# Patient Record
Sex: Male | Born: 1955 | Race: Black or African American | Hispanic: No | Marital: Single | Smoking: Current some day smoker
Health system: Southern US, Community
[De-identification: ages and names within clinical notes are randomized; demographics above are authoritative.]

## PROBLEM LIST (undated history)

## (undated) DIAGNOSIS — F79 Unspecified intellectual disabilities: Secondary | ICD-10-CM

## (undated) DIAGNOSIS — I10 Essential (primary) hypertension: Secondary | ICD-10-CM

## (undated) DIAGNOSIS — K219 Gastro-esophageal reflux disease without esophagitis: Secondary | ICD-10-CM

## (undated) DIAGNOSIS — E785 Hyperlipidemia, unspecified: Secondary | ICD-10-CM

## (undated) DIAGNOSIS — M1990 Unspecified osteoarthritis, unspecified site: Secondary | ICD-10-CM

## (undated) HISTORY — DX: Gastro-esophageal reflux disease without esophagitis: K21.9

## (undated) HISTORY — DX: Hyperlipidemia, unspecified: E78.5

## (undated) HISTORY — DX: Essential (primary) hypertension: I10

## (undated) HISTORY — DX: Unspecified intellectual disabilities: F79

---

## 2005-06-25 ENCOUNTER — Ambulatory Visit: Payer: Self-pay | Admitting: Family Medicine

## 2005-06-30 ENCOUNTER — Ambulatory Visit: Payer: Self-pay | Admitting: *Deleted

## 2005-07-15 ENCOUNTER — Ambulatory Visit: Payer: Self-pay | Admitting: Family Medicine

## 2005-08-16 ENCOUNTER — Ambulatory Visit: Payer: Self-pay | Admitting: Family Medicine

## 2005-10-19 ENCOUNTER — Ambulatory Visit: Payer: Self-pay | Admitting: Family Medicine

## 2006-01-26 ENCOUNTER — Emergency Department (HOSPITAL_COMMUNITY): Admission: EM | Admit: 2006-01-26 | Discharge: 2006-01-27 | Payer: Self-pay | Admitting: *Deleted

## 2006-03-18 ENCOUNTER — Ambulatory Visit (HOSPITAL_COMMUNITY): Admission: RE | Admit: 2006-03-18 | Discharge: 2006-03-18 | Payer: Self-pay | Admitting: *Deleted

## 2006-05-22 ENCOUNTER — Emergency Department (HOSPITAL_COMMUNITY): Admission: EM | Admit: 2006-05-22 | Discharge: 2006-05-22 | Payer: Self-pay | Admitting: Emergency Medicine

## 2006-07-26 ENCOUNTER — Ambulatory Visit: Payer: Self-pay | Admitting: Family Medicine

## 2006-10-18 ENCOUNTER — Ambulatory Visit: Payer: Self-pay | Admitting: Family Medicine

## 2006-10-18 LAB — CONVERTED CEMR LAB
Creatinine, Ser: 0.89 mg/dL
Hemoglobin: 13.8 g/dL
RDW: 15.6 %
WBC, blood: 4.8 10*3/uL

## 2006-11-07 ENCOUNTER — Encounter (INDEPENDENT_AMBULATORY_CARE_PROVIDER_SITE_OTHER): Payer: Self-pay | Admitting: Family Medicine

## 2006-11-07 DIAGNOSIS — F70 Mild intellectual disabilities: Secondary | ICD-10-CM

## 2006-11-07 LAB — CONVERTED CEMR LAB: Creatinine, Ser: 0.89 mg/dL

## 2006-11-24 DIAGNOSIS — I10 Essential (primary) hypertension: Secondary | ICD-10-CM | POA: Insufficient documentation

## 2006-11-24 DIAGNOSIS — K219 Gastro-esophageal reflux disease without esophagitis: Secondary | ICD-10-CM | POA: Insufficient documentation

## 2006-11-24 DIAGNOSIS — E785 Hyperlipidemia, unspecified: Secondary | ICD-10-CM

## 2006-11-24 DIAGNOSIS — M1990 Unspecified osteoarthritis, unspecified site: Secondary | ICD-10-CM | POA: Insufficient documentation

## 2007-02-08 ENCOUNTER — Encounter (INDEPENDENT_AMBULATORY_CARE_PROVIDER_SITE_OTHER): Payer: Self-pay | Admitting: *Deleted

## 2007-03-02 ENCOUNTER — Encounter (INDEPENDENT_AMBULATORY_CARE_PROVIDER_SITE_OTHER): Payer: Self-pay | Admitting: Family Medicine

## 2007-03-13 ENCOUNTER — Encounter (INDEPENDENT_AMBULATORY_CARE_PROVIDER_SITE_OTHER): Payer: Self-pay | Admitting: Family Medicine

## 2007-07-17 ENCOUNTER — Encounter (INDEPENDENT_AMBULATORY_CARE_PROVIDER_SITE_OTHER): Payer: Self-pay | Admitting: *Deleted

## 2007-09-13 ENCOUNTER — Ambulatory Visit: Payer: Self-pay | Admitting: Family Medicine

## 2007-09-14 ENCOUNTER — Encounter (INDEPENDENT_AMBULATORY_CARE_PROVIDER_SITE_OTHER): Payer: Self-pay | Admitting: *Deleted

## 2007-09-29 LAB — CONVERTED CEMR LAB
ALT: 16 units/L (ref 0–53)
AST: 21 units/L (ref 0–37)
Albumin: 5.1 g/dL (ref 3.5–5.2)
Alkaline Phosphatase: 75 units/L (ref 39–117)
BUN: 10 mg/dL (ref 6–23)
Basophils Absolute: 0 10*3/uL (ref 0.0–0.1)
Basophils Relative: 0 % (ref 0–1)
CO2: 24 meq/L (ref 19–32)
Calcium: 10 mg/dL (ref 8.4–10.5)
Chloride: 101 meq/L (ref 96–112)
Cholesterol: 225 mg/dL — ABNORMAL HIGH (ref 0–200)
Creatinine, Ser: 0.76 mg/dL (ref 0.40–1.50)
Eosinophils Absolute: 0.2 10*3/uL (ref 0.0–0.7)
Eosinophils Relative: 4 % (ref 0–5)
Glucose, Bld: 89 mg/dL (ref 70–99)
HCT: 40.1 % (ref 39.0–52.0)
HDL: 80 mg/dL (ref >39)
Hemoglobin: 13.4 g/dL (ref 13.0–17.0)
LDL Cholesterol: 130 mg/dL — ABNORMAL HIGH (ref 0–99)
Lymphocytes Relative: 31 % (ref 12–46)
Lymphs Abs: 1.5 10*3/uL (ref 0.7–4.0)
MCHC: 33.4 g/dL (ref 30.0–36.0)
MCV: 87 fL (ref 78.0–100.0)
Monocytes Absolute: 0.3 10*3/uL (ref 0.1–1.0)
Monocytes Relative: 7 % (ref 3–12)
Neutro Abs: 2.9 10*3/uL (ref 1.7–7.7)
Neutrophils Relative %: 59 % (ref 43–77)
PSA: 0.15 ng/mL (ref 0.10–4.00)
Platelets: 264 10*3/uL (ref 150–400)
Potassium: 4.4 meq/L (ref 3.5–5.3)
RBC: 4.61 M/uL (ref 4.22–5.81)
RDW: 13.7 % (ref 11.5–15.5)
Sodium: 142 meq/L (ref 135–145)
TSH: 3.581 microintl units/mL (ref 0.350–5.50)
Total Bilirubin: 0.5 mg/dL (ref 0.3–1.2)
Total CHOL/HDL Ratio: 2.8
Total Protein: 8.6 g/dL — ABNORMAL HIGH (ref 6.0–8.3)
Triglycerides: 73 mg/dL (ref <150)
VLDL: 15 mg/dL (ref 0–40)
WBC: 5 10*3/uL (ref 4.0–10.5)

## 2008-01-26 ENCOUNTER — Telehealth (INDEPENDENT_AMBULATORY_CARE_PROVIDER_SITE_OTHER): Payer: Self-pay | Admitting: Family Medicine

## 2008-08-14 ENCOUNTER — Ambulatory Visit: Payer: Self-pay | Admitting: Family Medicine

## 2008-08-14 LAB — CONVERTED CEMR LAB
Albumin: 3.9 g/dL (ref 3.5–5.2)
Alkaline Phosphatase: 112 units/L (ref 39–117)
BUN: 7 mg/dL (ref 6–23)
Basophils Absolute: 0 10*3/uL (ref 0.0–0.1)
CO2: 25 meq/L (ref 19–32)
Eosinophils Absolute: 0.2 10*3/uL (ref 0.0–0.7)
Eosinophils Relative: 3 % (ref 0–5)
Glucose, Bld: 104 mg/dL — ABNORMAL HIGH (ref 70–99)
HCT: 39.4 % (ref 39.0–52.0)
MCHC: 35.3 g/dL (ref 30.0–36.0)
MCV: 81.1 fL (ref 78.0–100.0)
Monocytes Absolute: 0.5 10*3/uL (ref 0.1–1.0)
Platelets: 218 10*3/uL (ref 150–400)
Potassium: 4 meq/L (ref 3.5–5.3)
RDW: 15.1 % (ref 11.5–15.5)
Total Bilirubin: 0.6 mg/dL (ref 0.3–1.2)
Total CK: 148 units/L (ref 7–232)

## 2008-08-21 ENCOUNTER — Ambulatory Visit: Payer: Self-pay | Admitting: Vascular Surgery

## 2008-08-21 ENCOUNTER — Ambulatory Visit (HOSPITAL_COMMUNITY): Admission: RE | Admit: 2008-08-21 | Discharge: 2008-08-21 | Payer: Self-pay | Admitting: Family Medicine

## 2008-08-21 ENCOUNTER — Encounter (INDEPENDENT_AMBULATORY_CARE_PROVIDER_SITE_OTHER): Payer: Self-pay | Admitting: Family Medicine

## 2008-09-04 ENCOUNTER — Encounter (INDEPENDENT_AMBULATORY_CARE_PROVIDER_SITE_OTHER): Payer: Self-pay | Admitting: Family Medicine

## 2008-10-06 ENCOUNTER — Telehealth (INDEPENDENT_AMBULATORY_CARE_PROVIDER_SITE_OTHER): Payer: Self-pay | Admitting: Family Medicine

## 2008-10-08 ENCOUNTER — Encounter (INDEPENDENT_AMBULATORY_CARE_PROVIDER_SITE_OTHER): Payer: Self-pay | Admitting: *Deleted

## 2008-10-23 ENCOUNTER — Ambulatory Visit: Payer: Self-pay | Admitting: Family Medicine

## 2008-10-23 DIAGNOSIS — R5383 Other fatigue: Secondary | ICD-10-CM

## 2008-10-23 DIAGNOSIS — R5381 Other malaise: Secondary | ICD-10-CM | POA: Insufficient documentation

## 2008-10-29 ENCOUNTER — Encounter (INDEPENDENT_AMBULATORY_CARE_PROVIDER_SITE_OTHER): Payer: Self-pay | Admitting: Family Medicine

## 2008-10-30 LAB — CONVERTED CEMR LAB
Cholesterol: 253 mg/dL — ABNORMAL HIGH (ref 0–200)
Sed Rate: 73 mm/hr — ABNORMAL HIGH (ref 0–16)
Total CHOL/HDL Ratio: 3
Vitamin B-12: 292 pg/mL (ref 211–911)

## 2008-11-21 ENCOUNTER — Encounter (INDEPENDENT_AMBULATORY_CARE_PROVIDER_SITE_OTHER): Payer: Self-pay | Admitting: *Deleted

## 2008-11-21 LAB — CONVERTED CEMR LAB
Anti Nuclear Antibody(ANA): NEGATIVE
Rhuematoid fact SerPl-aCnc: <20 intl units/mL (ref 0–20)

## 2009-03-27 ENCOUNTER — Emergency Department (HOSPITAL_COMMUNITY): Admission: EM | Admit: 2009-03-27 | Discharge: 2009-03-27 | Payer: Self-pay | Admitting: Emergency Medicine

## 2009-05-09 ENCOUNTER — Telehealth: Payer: Self-pay | Admitting: Physician Assistant

## 2009-06-26 ENCOUNTER — Other Ambulatory Visit: Payer: Self-pay

## 2009-06-27 ENCOUNTER — Ambulatory Visit: Payer: Self-pay | Admitting: Psychiatry

## 2009-06-28 ENCOUNTER — Inpatient Hospital Stay (HOSPITAL_COMMUNITY): Admission: EM | Admit: 2009-06-28 | Discharge: 2009-06-30 | Payer: Self-pay | Admitting: Psychiatry

## 2009-08-26 ENCOUNTER — Ambulatory Visit: Payer: Self-pay | Admitting: Physician Assistant

## 2009-08-26 DIAGNOSIS — F489 Nonpsychotic mental disorder, unspecified: Secondary | ICD-10-CM | POA: Insufficient documentation

## 2009-09-08 ENCOUNTER — Encounter: Payer: Self-pay | Admitting: Physician Assistant

## 2009-09-09 ENCOUNTER — Telehealth: Payer: Self-pay | Admitting: Physician Assistant

## 2009-09-09 ENCOUNTER — Ambulatory Visit: Payer: Self-pay | Admitting: Physician Assistant

## 2009-09-09 LAB — CONVERTED CEMR LAB: Rapid HIV Screen: NEGATIVE

## 2009-09-16 ENCOUNTER — Ambulatory Visit: Payer: Self-pay | Admitting: Physician Assistant

## 2009-09-17 LAB — CONVERTED CEMR LAB
ALT: 93 units/L — ABNORMAL HIGH (ref 0–53)
AST: 309 units/L — ABNORMAL HIGH (ref 0–37)
Albumin: 3.7 g/dL (ref 3.5–5.2)
Alkaline Phosphatase: 130 units/L — ABNORMAL HIGH (ref 39–117)
Basophils Absolute: 0 10*3/uL (ref 0.0–0.1)
Basophils Relative: 0 % (ref 0–1)
Hemoglobin: 14.5 g/dL (ref 13.0–17.0)
LDL Cholesterol: 77 mg/dL (ref 0–99)
Lymphocytes Relative: 34 % (ref 12–46)
MCHC: 35.1 g/dL (ref 30.0–36.0)
Monocytes Absolute: 0.3 10*3/uL (ref 0.1–1.0)
Neutro Abs: 2.2 10*3/uL (ref 1.7–7.7)
Neutrophils Relative %: 56 % (ref 43–77)
Platelets: 147 10*3/uL — ABNORMAL LOW (ref 150–400)
Potassium: 3.6 meq/L (ref 3.5–5.3)
RDW: 16.5 % — ABNORMAL HIGH (ref 11.5–15.5)
Sodium: 138 meq/L (ref 135–145)
TSH: 4.012 microintl units/mL (ref 0.350–4.500)
Total Bilirubin: 0.9 mg/dL (ref 0.3–1.2)
Total Protein: 7.2 g/dL (ref 6.0–8.3)
Triglycerides: 128 mg/dL (ref <150)
VLDL: 26 mg/dL (ref 0–40)

## 2009-09-19 ENCOUNTER — Encounter: Payer: Self-pay | Admitting: Physician Assistant

## 2009-09-19 DIAGNOSIS — Z862 Personal history of diseases of the blood and blood-forming organs and certain disorders involving the immune mechanism: Secondary | ICD-10-CM

## 2009-09-19 DIAGNOSIS — Z8639 Personal history of other endocrine, nutritional and metabolic disease: Secondary | ICD-10-CM

## 2009-09-19 LAB — CONVERTED CEMR LAB
ALT: 75 units/L — ABNORMAL HIGH (ref 0–53)
AST: 122 units/L — ABNORMAL HIGH (ref 0–37)
Albumin: 3.9 g/dL (ref 3.5–5.2)
Alkaline Phosphatase: 133 units/L — ABNORMAL HIGH (ref 39–117)
Bilirubin, Direct: 0.2 mg/dL (ref 0.0–0.3)
GGT: 319 units/L — ABNORMAL HIGH (ref 7–51)
Hep B Core Total Ab: POSITIVE — AB
Hep B S Ab: POSITIVE — AB
Total Bilirubin: 0.6 mg/dL (ref 0.3–1.2)

## 2009-09-22 ENCOUNTER — Encounter: Payer: Self-pay | Admitting: Physician Assistant

## 2009-09-26 ENCOUNTER — Encounter (INDEPENDENT_AMBULATORY_CARE_PROVIDER_SITE_OTHER): Payer: Self-pay | Admitting: *Deleted

## 2009-09-26 ENCOUNTER — Ambulatory Visit: Payer: Self-pay | Admitting: Physician Assistant

## 2009-09-26 DIAGNOSIS — R9431 Abnormal electrocardiogram [ECG] [EKG]: Secondary | ICD-10-CM

## 2009-09-26 LAB — CONVERTED CEMR LAB
Bilirubin Urine: NEGATIVE
Ketones, urine, test strip: NEGATIVE
Nitrite: NEGATIVE
Protein, U semiquant: NEGATIVE
Urobilinogen, UA: 0.2

## 2009-09-29 DIAGNOSIS — E559 Vitamin D deficiency, unspecified: Secondary | ICD-10-CM | POA: Insufficient documentation

## 2009-09-29 LAB — CONVERTED CEMR LAB: Vit D, 25-Hydroxy: 20 ng/mL — ABNORMAL LOW (ref 30–89)

## 2009-10-01 ENCOUNTER — Encounter: Payer: Self-pay | Admitting: Physician Assistant

## 2009-10-01 ENCOUNTER — Encounter (INDEPENDENT_AMBULATORY_CARE_PROVIDER_SITE_OTHER): Payer: Self-pay | Admitting: Internal Medicine

## 2009-10-01 ENCOUNTER — Ambulatory Visit (HOSPITAL_COMMUNITY): Admission: RE | Admit: 2009-10-01 | Discharge: 2009-10-01 | Payer: Self-pay | Admitting: Internal Medicine

## 2009-10-01 ENCOUNTER — Ambulatory Visit: Payer: Self-pay | Admitting: Cardiology

## 2009-10-03 ENCOUNTER — Encounter: Payer: Self-pay | Admitting: Physician Assistant

## 2009-10-06 ENCOUNTER — Encounter: Payer: Self-pay | Admitting: Physician Assistant

## 2009-10-07 ENCOUNTER — Encounter (INDEPENDENT_AMBULATORY_CARE_PROVIDER_SITE_OTHER): Payer: Self-pay | Admitting: *Deleted

## 2009-10-10 ENCOUNTER — Encounter: Payer: Self-pay | Admitting: Physician Assistant

## 2009-10-16 ENCOUNTER — Ambulatory Visit (HOSPITAL_COMMUNITY): Admission: RE | Admit: 2009-10-16 | Discharge: 2009-10-16 | Payer: Self-pay | Admitting: Internal Medicine

## 2009-10-24 ENCOUNTER — Ambulatory Visit: Payer: Self-pay | Admitting: Physician Assistant

## 2009-10-27 ENCOUNTER — Encounter: Payer: Self-pay | Admitting: Physician Assistant

## 2009-10-27 DIAGNOSIS — K7689 Other specified diseases of liver: Secondary | ICD-10-CM

## 2009-10-27 LAB — CONVERTED CEMR LAB
AST: 160 units/L — ABNORMAL HIGH (ref 0–37)
Alkaline Phosphatase: 120 units/L — ABNORMAL HIGH (ref 39–117)
Indirect Bilirubin: 0.7 mg/dL (ref 0.0–0.9)
Total Bilirubin: 1 mg/dL (ref 0.3–1.2)

## 2009-10-31 ENCOUNTER — Ambulatory Visit: Payer: Self-pay | Admitting: Physician Assistant

## 2009-11-25 ENCOUNTER — Ambulatory Visit: Payer: Self-pay | Admitting: Physician Assistant

## 2009-11-25 DIAGNOSIS — R634 Abnormal weight loss: Secondary | ICD-10-CM

## 2009-11-25 LAB — CONVERTED CEMR LAB
BUN: 10 mg/dL (ref 6–23)
CO2: 26 meq/L (ref 19–32)
Chloride: 99 meq/L (ref 96–112)
Eosinophils Absolute: 0.1 10*3/uL (ref 0.0–0.7)
Eosinophils Relative: 1 % (ref 0–5)
Glucose, Bld: 101 mg/dL — ABNORMAL HIGH (ref 70–99)
HCT: 37.9 % — ABNORMAL LOW (ref 39.0–52.0)
Hemoglobin: 13.3 g/dL (ref 13.0–17.0)
Indirect Bilirubin: 0.7 mg/dL (ref 0.0–0.9)
Lymphs Abs: 1.8 10*3/uL (ref 0.7–4.0)
MCHC: 35.1 g/dL (ref 30.0–36.0)
MCV: 91.1 fL (ref 78.0–100.0)
Monocytes Absolute: 0.6 10*3/uL (ref 0.1–1.0)
Monocytes Relative: 10 % (ref 3–12)
Potassium: 4.9 meq/L (ref 3.5–5.3)
RBC: 4.16 M/uL — ABNORMAL LOW (ref 4.22–5.81)
Total Bilirubin: 1 mg/dL (ref 0.3–1.2)
Total Protein: 7 g/dL (ref 6.0–8.3)
Triglycerides: 814 mg/dL — ABNORMAL HIGH (ref <150)
WBC: 5.8 10*3/uL (ref 4.0–10.5)

## 2009-11-28 ENCOUNTER — Telehealth: Payer: Self-pay | Admitting: Physician Assistant

## 2009-11-28 ENCOUNTER — Encounter (INDEPENDENT_AMBULATORY_CARE_PROVIDER_SITE_OTHER): Payer: Self-pay | Admitting: *Deleted

## 2009-11-28 ENCOUNTER — Encounter: Payer: Self-pay | Admitting: Physician Assistant

## 2009-12-03 ENCOUNTER — Encounter: Payer: Self-pay | Admitting: Physician Assistant

## 2009-12-10 ENCOUNTER — Telehealth: Payer: Self-pay | Admitting: Physician Assistant

## 2009-12-16 ENCOUNTER — Encounter (INDEPENDENT_AMBULATORY_CARE_PROVIDER_SITE_OTHER): Payer: Self-pay | Admitting: *Deleted

## 2009-12-23 ENCOUNTER — Telehealth: Payer: Self-pay | Admitting: Physician Assistant

## 2009-12-23 ENCOUNTER — Encounter: Payer: Self-pay | Admitting: Physician Assistant

## 2009-12-29 ENCOUNTER — Encounter (INDEPENDENT_AMBULATORY_CARE_PROVIDER_SITE_OTHER): Payer: Self-pay | Admitting: *Deleted

## 2009-12-29 ENCOUNTER — Encounter: Payer: Self-pay | Admitting: Physician Assistant

## 2010-01-06 ENCOUNTER — Encounter: Payer: Self-pay | Admitting: Physician Assistant

## 2010-01-06 ENCOUNTER — Ambulatory Visit: Payer: Self-pay | Admitting: Gastroenterology

## 2010-01-06 DIAGNOSIS — K7010 Alcoholic hepatitis without ascites: Secondary | ICD-10-CM

## 2010-01-06 LAB — CONVERTED CEMR LAB
Ferritin: 406.1 ng/mL — ABNORMAL HIGH (ref 22.0–322.0)
Folate: 6.5 ng/mL
Saturation Ratios: 27.6 % (ref 20.0–50.0)

## 2010-01-07 ENCOUNTER — Telehealth: Payer: Self-pay | Admitting: Gastroenterology

## 2010-01-20 ENCOUNTER — Ambulatory Visit: Payer: Self-pay | Admitting: Gastroenterology

## 2010-01-22 ENCOUNTER — Ambulatory Visit: Payer: Self-pay | Admitting: Physician Assistant

## 2010-01-22 ENCOUNTER — Telehealth: Payer: Self-pay | Admitting: Physician Assistant

## 2010-01-22 DIAGNOSIS — F1010 Alcohol abuse, uncomplicated: Secondary | ICD-10-CM | POA: Insufficient documentation

## 2010-01-23 LAB — CONVERTED CEMR LAB
BUN: 5 mg/dL — ABNORMAL LOW (ref 6–23)
CO2: 28 meq/L (ref 19–32)
Calcium: 9.7 mg/dL (ref 8.4–10.5)
Chloride: 100 meq/L (ref 96–112)
Creatinine, Ser: 0.75 mg/dL (ref 0.40–1.50)

## 2010-01-29 ENCOUNTER — Encounter (INDEPENDENT_AMBULATORY_CARE_PROVIDER_SITE_OTHER): Payer: Self-pay | Admitting: *Deleted

## 2010-02-02 ENCOUNTER — Ambulatory Visit: Payer: Self-pay | Admitting: Gastroenterology

## 2010-02-02 ENCOUNTER — Encounter: Payer: Self-pay | Admitting: Physician Assistant

## 2010-02-02 DIAGNOSIS — K2270 Barrett's esophagus without dysplasia: Secondary | ICD-10-CM

## 2010-02-02 LAB — CONVERTED CEMR LAB: UREASE: NEGATIVE

## 2010-02-03 ENCOUNTER — Ambulatory Visit: Payer: Self-pay | Admitting: Physician Assistant

## 2010-02-05 ENCOUNTER — Encounter: Payer: Self-pay | Admitting: Gastroenterology

## 2010-02-11 ENCOUNTER — Encounter: Payer: Self-pay | Admitting: Physician Assistant

## 2010-03-10 ENCOUNTER — Encounter: Payer: Self-pay | Admitting: Gastroenterology

## 2010-05-05 ENCOUNTER — Emergency Department (HOSPITAL_COMMUNITY)
Admission: EM | Admit: 2010-05-05 | Discharge: 2010-05-05 | Payer: Self-pay | Source: Home / Self Care | Admitting: Family Medicine

## 2010-06-14 ENCOUNTER — Encounter: Payer: Self-pay | Admitting: Internal Medicine

## 2010-06-23 NOTE — Letter (Signed)
Summary: MED/SOLUTIONS//DENIAL  MED/SOLUTIONS//DENIAL   Imported By: Arta Bruce 10/06/2009 09:49:07  _____________________________________________________________________  External Attachment:    Type:   Image     Comment:   External Document

## 2010-06-23 NOTE — Letter (Signed)
Summary: *HSN Results Follow up  [facility]  [address] [place], [place] [address]   Phone: [phone]  Fax: [phone]      10/27/2009   ADRIAAN MALTESE [address] [place], [place]  [address]   Dear  Mr. Ruston Barlowe,                            ____S.Drinkard,FNP   ____D. Gore,FNP       ____B. McPherson,MD   ____V. Rankins,MD    ____E. Mulberry,MD    ____N. Daphine Deutscher, FNP  ____D. Reche Dixon, MD    ____K. Philipp Deputy, MD    __x__S. Alben Spittle, PA-C     This letter is to inform you that your recent test(s):  _______Pap Smear    _______Lab Test     _______X-ray    _______ is within acceptable limits  _______ requires a medication change  _______ requires a follow-up lab visit  _______ requires a follow-up visit with your provider   Comments: Echocardiogram (ultrasound on your heart) looked good.       _________________________________________________________ If you have any questions, please contact our office                     Sincerely,  Tereso Newcomer PA-C [facility]

## 2010-06-23 NOTE — Letter (Signed)
Summary: *HSN Results Follow up  [facility]  [address] [place], [place] [address]   Phone: [phone]  Fax: [phone]      10/07/2009   [name] [address] [place], [place]  [address]   Dear  Mr. Timothy Odonnell,                            ____S.Drinkard,FNP   __X__S. Alben Spittle, PA       ____B. McPherson,MD   ____V. Rankins,MD    ____E. Mulberry,MD    ____N. Daphine Deutscher, FNP  ____D. Reche Dixon, MD    ____K. Philipp Deputy, MD    ____Other     This letter is to inform you that your recent test(s):  _______Pap Smear    ___X____Lab Test     _______X-ray    _______ is within acceptable limits  ___X____ requires a medication change  ____X___ requires a follow-up lab visit  _______ requires a follow-up visit with your provider   Comments:  Vit. D level was low.  Please call to schedule a follow-up lab in six months.  Prescription is enclosed for Vit. D supplement.     _________________________________________________________ If you have any questions, please contact our office                     Sincerely,  Dutch Quint RN [facility]

## 2010-06-23 NOTE — Letter (Signed)
Summary: [facility]  [facility]   Imported By: Arta Bruce 01/27/2010 15:24:33  _____________________________________________________________________  External Attachment:    Type:   Image     Comment:   External Document

## 2010-06-23 NOTE — Procedures (Signed)
Summary: Colonoscopy  Patient: Timothy Odonnell Note: All result statuses are Final unless otherwise noted.  Tests: (1) Colonoscopy (COL)   COL Colonoscopy           DONE     [facility]     [address]     [place], [place]  [address]           COLONOSCOPY PROCEDURE REPORT           PATIENT:  Timothy, Odonnell  MR#:  [id number]     BIRTHDATE:  1955/09/04, 54 yrs. old  GENDER:  male     ENDOSCOPIST:  Vania Rea. Jarold Motto, MD, [facility]     REF. BY:     PROCEDURE DATE:  02/02/2010     PROCEDURE:  Average-risk screening colonoscopy     G0121     ASA CLASS:  Class II     INDICATIONS:  Routine Risk Screening     MEDICATIONS:   Fentanyl 100 mcg IV, Versed 10 mg IV           DESCRIPTION OF PROCEDURE:   After the risks benefits and     alternatives of the procedure were thoroughly explained, informed     consent was obtained.  Digital rectal exam was performed and     revealed no abnormalities.   The [id number] endoscope was     introduced through the anus and advanced to the cecum, which was     identified by both the appendix and ileocecal valve, without     limitations.  The quality of the prep was good, using MoviPrep.     The instrument was then slowly withdrawn as the colon was fully     examined.     <<PROCEDUREIMAGES>>           FINDINGS:  No polyps or cancers were seen.  This was otherwise a     normal examination of the colon.   Retroflexed views in the rectum     revealed no abnormalities.    The scope was then withdrawn from     the patient and the procedure completed.           COMPLICATIONS:  None     ENDOSCOPIC IMPRESSION:     1) No polyps or cancers     2) Otherwise normal examination     RECOMMENDATIONS:     1) Continue current colorectal screening recommendations for     "routine risk" patients with a repeat colonoscopy in 10 years.     REPEAT EXAM:  No           ______________________________     Vania Rea. Jarold Motto, MD, Clementeen Graham           CC:  Julieanne Manson, MDWeaver, Scott PA           n.     eSIGNED:   Vania Rea. Patterson at 02/02/2010 03:06 PM           Timmie Foerster, [id number]  Note: An exclamation mark (!) indicates a result that was not dispersed into the flowsheet. Document Creation Date: 02/02/2010 3:07 PM _______________________________________________________________________  (1) Order result status: Final Collection or observation date-time: 02/02/2010 15:00 Requested date-time:  Receipt date-time:  Reported date-time:  Referring Physician:   Ordering Physician: Sheryn Bison [phone]) Specimen Source:  Source: Launa Grill Order Number: [id number] Lab site:   Appended Document: Colonoscopy    Clinical Lists Changes  Observations: Added new observation of COLONNXTDUE:  01/2020 (02/02/2010 15:44)      Appended Document: Colonoscopy     Procedures Next Due Date:    Colonoscopy: 01/2020

## 2010-06-23 NOTE — Assessment & Plan Note (Signed)
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 Screening of her brow hepatitis has been negative except for evidence of hepatitis B immunity. His liver enzyme picture is classical for alcoholic hepatitis with SGOT more than twice SGPT levels. The patient readily admits continued alcohol abuse. He's had no specific known episodes of hepatitis or pancreatitis. His appetite is good his weight is stable.  History of present reflux with H2 blocker therapy with mild improvement. He denies hematemesis, nausea vomiting, dysphagia, or change in bowel habits. His [facility] physicians I referred him for screening colonoscopy. He has a history of psychiatric disturbance of unknown etiology and apparently is seen in mental health. Other problems included rather severe hyperlipidemia, osteoarthritis, and sleep apnea. He denies IV drug use. He is on a multitude of medications listed and reviewed his chart. Her extensive records available for patient has a poor social situation he lives by himself in a trailer, and he is not sure he can afford his medications or bring anyone for his procedures.  Recent ultrasound also was reviewed and shows fatty infiltration liver. Serum alpha-fetoprotein level has been ordered. Ultrasound showed no evidence of ascites or other findings of portal hypertension. The patient self denies any significant change in his mental status, but he is an unreliable historian.   GI  Review of Systems    Reports acid reflux and  heartburn.      Denies abdominal pain, belching, bloating, chest pain, dysphagia with liquids, dysphagia with solids, loss of appetite, nausea, vomiting, vomiting blood, weight loss, and  weight gain.      Reports diarrhea and  liver problems.     Denies anal fissure, black tarry stools, change in bowel habit, constipation, diverticulosis, fecal incontinence, heme positive stool, hemorrhoids, irritable bowel syndrome, jaundice, light color stool, rectal bleeding, and  rectal pain. Preventive Screening-Counseling & Management      Drug Use:  no.      Current Medications (verified): 1)  Lopid 600 Mg  Tabs (Gemfibrozil) .... Do Not Take For Now 2)  One-Daily Multivitamins   Tabs (Multiple Vitamin) .Marland Kitchen.. 1 By Mouth Daily 3)  Benztropine Mesylate 1 Mg Tabs (Benztropine Mesylate) .... Take One (1) Tablet At Bedtime (Dr.steele) 4)  Perphenazine 8 Mg Tabs (Perphenazine) .... Take 4 Tablets By Mouth At Bedtime([facility]) 5)  Pravastatin Sodium 20 Mg Tabs (Pravastatin Sodium) .... Do Not Take For Now 6)  Pepcid 20 Mg Tabs (Famotidine) .... Take 1 Tablet By Mouth Two Times A Day As Needed For Stomach Acid 7)  Vitamin D 400 Unit Tabs (Cholecalciferol) .... Take 2 Tabs By Mouth Once Daily. 8)  Lovaza 1 Gm Caps (Omega-3-Acid Ethyl Esters) .... Take 1 Capsule By Mouth Two Times A Day  Allergies (verified): 1)  ! Bactrim  Past History:  Past medical, surgical, family and social histories (including risk factors) reviewed for relevance to current acute and chronic problems.  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 Echo 09/2009:  Normal LVF; no wall motion abnormalities  Arthritis sleep apnea  Past Surgical History: Reviewed history from 11/07/2006 and no changes required. Denies surgical history  Family History: Reviewed  history from 09/26/2009 and no changes required. emphysema - dad Family History Hypertension - mom Family History of Breast Cancer:aunt No FH of Colon Cancer:  Social History: Reviewed history from 08/26/2009 and no changes required. Occupation:unemployed/ disability Single Illiterate. Lives on own in mobile home.Lives near his uncle. He has no phone. He does not drive. Has food stamps.Has running water and electricity. He can walk to pharmacy. Has nephew that helps him also. smokes occ cigar no drugs Alcohol Use - yes   1-3 beers per day Illicit Drug Use - no Drug Use:  no  Review of Systems       The patient complains of allergy/sinus, anxiety-new, arthritis/joint pain, fatigue, muscle pains/cramps, night sweats, sleeping problems, and vision changes.  The patient denies back pain, blood in urine, breast changes/lumps, change in vision, confusion, cough, coughing up blood, depression-new, fainting, fever, headaches-new, hearing problems, heart murmur, heart rhythm changes, itching, nosebleeds, shortness of breath, skin rash, sore throat, swelling of feet/legs, swollen lymph glands, thirst - excessive, urination - excessive, urination changes/pain, urine leakage, and voice change.   General:  Complains of fatigue, weakness, and sleep disorder; denies fever, chills, sweats, anorexia, malaise, and weight loss. Eyes:  Complains of blurring and diplopia; denies irritation, discharge, vision loss, scotoma, eye pain, and photophobia. ENT:  Denies earache, ear discharge, tinnitus, decreased hearing, nasal congestion, loss of smell, nosebleeds, sore throat, hoarseness, and difficulty swallowing. CV:  Denies chest pains, angina, palpitations, syncope, dyspnea on exertion, orthopnea, PND, peripheral edema, and claudication. Resp:  Denies dyspnea at rest, dyspnea with exercise, cough, sputum, wheezing, coughing up blood, and pleurisy. GI:  Complains of indigestion/heartburn; denies difficulty  swallowing, pain on swallowing, nausea, vomiting, vomiting blood, abdominal pain, jaundice, gas/bloating, diarrhea, constipation, change in bowel habits, bloody BM's, black BMs, and fecal incontinence. GU:  Complains of urinary frequency; denies urinary burning, blood in urine, urinary hesitancy, nocturnal urination, urinary incontinence, penile discharge, genital sores, decreased libido, and erectile dysfunction. MS:  Complains of joint swelling, joint deformity, low back pain, and muscle cramps. Derm:  Complains of dry skin. Neuro:  Complains of tremors; denies weakness, paralysis, abnormal sensation, seizures, syncope, vertigo, transient blindness, frequent falls, frequent headaches, difficulty walking, headache, sciatica, radiculopathy other:, restless legs, memory loss, and confusion. Psych:  Complains of depression, anxiety, and confusion; denies memory loss, suicidal ideation, hallucinations, paranoia, and phobia. Endo:  Complains of polydipsia; denies cold intolerance, heat intolerance, polyphagia, polyuria, unusual weight change, and hirsutism. Heme:  Complains of bruising; denies bleeding, enlarged lymph nodes, and pagophagia. Allergy:  Complains of hay fever and recurrent infections.  Vital Signs:  Patient profile:   55 year old male Height:      72.75 inches Weight:      214 pounds BMI:     28.53 BSA:     2.21 Pulse rate:   78 / minute Pulse rhythm:   regular BP sitting:   122 / 90  (left arm)  Vitals Entered By: Merri Ray CMA (AAMA) (January 06, 2010 8:12 AM)  Physical Exam  General:  Well developed, well nourished, no acute distress. Head:  Normocephalic and atraumatic. Eyes:  PERRLA, no icterus. Neck:  Supple; no masses or thyromegaly. Lungs:  Clear throughout to auscultation. Heart:  Regular rate and rhythm; no murmurs, rubs,  or bruits. Abdomen:  Soft, nontender and nondistended. No masses, hepatosplenomegaly or hernias  noted. Normal bowel sounds. Rectal:   deferred until time of colonoscopy.   Msk:  Symmetrical with no gross deformities. Normal posture. Extremities:  No clubbing, cyanosis, edema or deformities noted. Neurologic:  Alert and  oriented x4;  grossly normal neurologically. Skin:  severe stasis dermatitis with hyperpigmentation and crusting of the lower extremities with +1 edema bilaterally but no evidence of phlebitis. Cervical Nodes:  No significant cervical adenopathy. Psych:  Alert and cooperative. Normal mood and affect.poor concentration.     Impression & Recommendations:  Problem # 1:  ALCOHOLIC HEPATITIS (ICD-571.1) Assessment Unchanged Probable child's class a cirrhosis---repeat liver function tests, check prothrombin time, iron levels, and CDT to assess his current alcohol abuse. The chances of this patient avoid alcohol nonexistent. I doubt there is much of rehabilitation bed to his care short of referring him back to [facility] for alcohol abuse counseling. We will check serum ammonia level also. Orders: TLB-B12, Serum-Total ONLY ([address]) TLB-Ferritin (82728-FER) TLB-Folic Acid (Folate) (82746-FOL) TLB-IBC Pnl (Iron/FE;Transferrin) (83550-IBC) T-Alpha-Fetoprotein Serum (19147-82956) T-CDT (carbohydrate deficient transferrin) (21308-65784)  Problem # 2:  PSYCHIATRIC DISORDER (ICD-300.9) Assessment: Unchanged Will request further information concerning his psychiatric diagnosis and therapy. I sincerely doubt he will follow through with endoscopy or colonoscopy exams from a psychiatric standpoint, financial standpoint, we'll compliance standpoint in terms of a rather rigid rigorous bowel preparation and also the need for someone to be present before and after his exam.  Problem # 3:  GERD (ICD-530.81) Assessment: Improved Samples of AcipHex 20 mg a day given. We will make the patient an appointment for pre-visit to hopefully schedule endoscopy and colonoscopy pending above assessment of obvious  impediments. Orders: TLB-B12, Serum-Total ONLY ([address]-B[address]) TLB-Ferritin (82728-FER) TLB-Folic Acid (Folate) (82746-FOL) TLB-IBC Pnl (Iron/FE;Transferrin) (83550-IBC) T-Alpha-Fetoprotein Serum (41324-40102) T-CDT (carbohydrate deficient transferrin) (72536-64403)  Problem # 4:  HYPERTENSION (ICD-401.9) Assessment: Improved blood pressure today 122/90 he is to continue his other cardiac medications as per [facility]. He is also to continue his Lopid for his hyperlipidemia.  Patient Instructions: 1)  Please go to the basement for lab work. 2)  Begin Aciphex once daily.  Samples given. 3)  Have your uncle call the office to arrange an appointment to discuss a colonoscopy and upper endoscopy. 4)  complete alcohol avoidance suggested with AA attendance. 5)  Copy sent to : Dr. Julieanne Manson [facility] and Thomasenia Bottoms PA. 6)  Please continue current medications.

## 2010-06-23 NOTE — Letter (Signed)
Summary: scat eligibility application  scat eligibility application   Imported By: Arta Bruce 09/08/2009 11:59:22  _____________________________________________________________________  External Attachment:    Type:   Image     Comment:   External Document

## 2010-06-23 NOTE — Medication Information (Signed)
Summary: pa lovaza APPROVED  pa lovaza APPROVED   Imported By: Arta Bruce 01/07/2010 15:27:58  _____________________________________________________________________  External Attachment:    Type:   Image     Comment:   External Document

## 2010-06-23 NOTE — Letter (Signed)
Summary: *HSN Results Follow up  [facility]  [address] [place], [place] [address]   Phone: [phone]  Fax: [phone]      10/10/2009   Timothy Odonnell [address] [place], [place]  [address]   Dear  Mr. [name],                            ____S.Drinkard,FNP   ____D. Gore,FNP       ____B. McPherson,MD   ____V. Rankins,MD    ____E. Mulberry,MD    ____N. Daphine Deutscher, FNP  ____D. Reche Dixon, MD    ____K. Philipp Deputy, MD    _x___S. Alben Spittle, PA-C     This letter is to inform you that your recent test(s):  _______Pap Smear    _______Lab Test     _______X-ray    _______ is within acceptable limits  _______ requires a medication change  _______ requires a follow-up lab visit  _______ requires a follow-up visit with your provider   Comments: Stool cards negative for blood.       _________________________________________________________ If you have any questions, please contact our office                     Sincerely,  Tereso Newcomer PA-C [facility]

## 2010-06-23 NOTE — Progress Notes (Signed)
Summary: Lopid question  Phone Note Outgoing Call   Summary of Call: Dr. Jarold Motto, Thank you for seeing Timothy Odonnell. I have a question.  I had him stop the Lopid and Pravastatin due to his increased LFTs.   With his alcohol abuse and elevated LFTs, do you think I could put him back on the Lopid?    His trig's are very high.  I have tried to involve [facility] to get him a case manager to help but she cannot reach him. I have tried to get him started on Fish oil, but I cannot be sure he is taking it. He is on my schedule today and hopefully he will show up.  Thanks again, [place] Initial call taken by: Brynda Rim,  January 22, 2010 8:22 AM  Follow-up for Phone Call        Lipid meds should be ok to use.... Follow-up by: Mardella Layman MD [facility],  January 22, 2010 8:40 AM

## 2010-06-23 NOTE — Progress Notes (Signed)
Summary: BRING ALL MEDS IN  Phone Note Outgoing Call   Summary of Call: Please call Timothy Odonnell and have him bring his medicines here so we can confirm what he is taking and what he is not taking. Initial call taken by: Tereso Newcomer PA-C,  November 28, 2009 2:41 PM  Follow-up for Phone Call        Left message on answering machine for pt to call back.Marland KitchenMarland KitchenArmenia Shannon  November 28, 2009 4:24 PM  tried calling pt but no answer .Marland KitchenMarland KitchenArmenia Shannon  December 03, 2009 10:06 AM  Unable to leave message -- no answering device.  Dutch Quint RN  December 04, 2009 12:52 PM   Additional Follow-up for Phone Call Additional follow up Details #1::        Timothy Odonnell CALLED, AND I GAVE HIM THE MESSAGE THAT Timothy Odonnell WANTS HIM TO BRING ALL HIS MEDS BY, SO HE CAN SEE WHAT HE'S TAKING AND NOT. Additional Follow-up by: Leodis Rains,  December 04, 2009 2:58 PM

## 2010-06-23 NOTE — Progress Notes (Signed)
Summary: Checked Mr. Ruacho current meds  Phone Note Other Incoming   Summary of Call: Pt. came in with bottles of meds.  States he is not taking Gemfibrozil, pravastatin or lipitor (still has bottles with meds for gemfibrozil -- almost full-- and pravastatin).  Had famotidine, benztropine and perphenazine that were last filled 08/26/09.  States he has been taking pepcid almost daily, lately, but it is obvious that he has not taken the other two as prescribed.  I went over the frequency and importance of taking them regularly with him, not sure how much was understood.  Did not have Vit. D or multiple vitamins with him.  States he has a regular place to stay, but his uncle is his best contact and we have his correct number. Initial call taken by: Dutch Quint RN,  December 10, 2009 9:32 AM  Follow-up for Phone Call        Please see if we can find out where he goes for mental health.  He may go to [facility].  We need to notify them that he is not taking his medicines (benztropine and perphenazine) correctly. Tereso Newcomer PA-C  December 10, 2009 4:41 PM   Also, never got CXR done.  Please advise him to get CXR done. Tereso Newcomer PA-C  December 10, 2009 5:15 PM   Additional Follow-up for Phone Call Additional follow up Details #1::        Left message on answering machine for pt to call back.Marland KitchenMarland KitchenMarland KitchenArmenia Shannon  December 12, 2009 3:36 PM  Left message on answering machine for pt to call back.... Arm[place] Shannon  December 15, 2009 3:59 PM   Left message on answering machine for pt to call back.Marland KitchenMarland KitchenMarland KitchenMarland Kitchen will mail letter..... [place] Shannon  December 16, 2009 12:12 PM     New/Updated Medications: BENZTROPINE MESYLATE 1 MG TABS (BENZTROPINE MESYLATE) TAKE ONE (1) TABLET AT BEDTIME (Dr.Steele) PERPHENAZINE 8 MG TABS (PERPHENAZINE) Take 4 tablets by mouth at bedtime([facility])  Appended Document: Checked Mr. Maselli current meds spoke with pt and informed him about the cxr he needs to do ... also pt says he goes [facility] [place]

## 2010-06-23 NOTE — Letter (Signed)
Summary: SCAT ELIGIBILITY APPLICATION  SCAT ELIGIBILITY APPLICATION   Imported By: Arta Bruce 09/22/2009 09:53:26  _____________________________________________________________________  External Attachment:    Type:   Image     Comment:   External Document

## 2010-06-23 NOTE — Letter (Signed)
Summary: MED/SOLUTIONS//APPROVED  MED/SOLUTIONS//APPROVED   Imported By: Arta Bruce 12/03/2009 12:39:42  _____________________________________________________________________  External Attachment:    Type:   Image     Comment:   External Document

## 2010-06-23 NOTE — Procedures (Signed)
Summary: Instructions for procedure/[facility]  Instructions for procedure/[facility]   Imported By: Sherian Rein 01/27/2010 09:45:24  _____________________________________________________________________  External Attachment:    Type:   Image     Comment:   External Document

## 2010-06-23 NOTE — Progress Notes (Signed)
Summary: GI referral  Phone Note Outgoing Call   Summary of Call: Please refer to GI for screening colo.  Needs eval for weight loss and early satiety.  ? need EGD.  Also has elevated LFTs.  Please send referral letter in system. Initial call taken by: Tereso Newcomer PA-C,  November 28, 2009 2:56 PM

## 2010-06-23 NOTE — Assessment & Plan Note (Signed)
Summary: f/u visit // tl   Vital Signs:  Patient profile:   55 year old male Height:      72.75 inches Weight:      218 pounds BMI:     29.06 Temp:     97.9 degrees F oral Pulse rate:   81 / minute Pulse rhythm:   regular Resp:     18 per minute BP sitting:   116 / 82  (left arm) Cuff size:   large  Vitals Entered By: [place] Shannon (November 25, 2009 8:51 AM) CC: f/u...Timothy Odonnell pt wants to know why he is losing weight..... Is Patient Diabetic? No Pain Assessment Patient in pain? no       Does patient need assistance? Functional Status Self care Ambulation Normal   Primary Care Provider:  Tereso Newcomer PA-C  CC:  f/u...Timothy Odonnell pt wants to know why he is losing weight.....Timothy Odonnell  History of Present Illness: Here for f/u on elevated LFTs.  I have been following his LFTs over several weeks.  His u/s shows fatty infiltration.  His A1C is 6.1.  His AST is consistently greater than ALT.  He admits to drinking alcohol.  States he drinks beer.  States he drinks 2-3 cans of beer a day.  We have asked him to stop drinking.  He denies drinking more than that.  He states if he drinks more than 1-2 cans of beer, he gets "sick."  States he vomits.  His AST has been > 3x the upper limits of normal.  So, with fatty liver, I have been hesitant to start him back on a statin.  He may benefit from being on metformin.  LFTs may improve with this.  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  HCV antibody was negative.  Timothy Odonnell complains of weight loss.  He weighed 235 in April and 218 today.  He denies trying to lose weight.  Not eating as much as he used to.  Lives by himself.  Mother used to live with him and she moved out a couple years ago.  She used to cook for him.  He now is responsible for fixing his own meals.  He only eats about 2 meals a day.  He denies losing his appetite.  He notes nausea shortly after eating.  Goes to [employer].  Eats Roselie Skinner.  Admits to indigestion at times.  Feels full  quickly.  No hematemesis.  No melena or hematochezia.  Denies h/o smoking cigarettes.  Does smoke occ. cigar.    Current Medications (verified): 1)  Lopid 600 Mg  Tabs (Gemfibrozil) .... Do Not Take For Now 2)  One-Daily Multivitamins   Tabs (Multiple Vitamin) .Timothy Odonnell.. 1 By Mouth Daily 3)  Benztropine Mesylate 1 Mg Tabs (Benztropine Mesylate) .... Take One (1) Tablet At Bedtime (Dr.steele) 4)  Perphenazine 8 Mg Tabs (Perphenazine) .... Take 4 Tablets By Mouth At Bedtime([facility]) 5)  Pravastatin Sodium 20 Mg Tabs (Pravastatin Sodium) .... Do Not Take For Now 6)  Pepcid 20 Mg Tabs (Famotidine) .... Take 1 Tablet By Mouth Two Times A Day As Needed For Stomach Acid 7)  Vitamin D 400 Unit Tabs (Cholecalciferol) .... Take 2 Tabs By Mouth Once Daily.  Allergies (verified): 1)  ! Bactrim  Physical Exam  General:  alert, well-developed, and well-nourished.   Head:  normocephalic and atraumatic.   Neck:  supple.   Lungs:  normal breath sounds.   Heart:  normal rate and regular rhythm.  Abdomen:  soft, non-tender, normal bowel sounds, and no hepatomegaly.   Neurologic:  alert & oriented X3 and cranial nerves II-XII intact.   Psych:  good eye contact.     Impression & Recommendations:  Problem # 1:  LOSS OF WEIGHT (ICD-783.21) I think this is related to his poor diet. He needs to see GI for screening colonoscopy. Will have him evaluated for GERD and weight loss as well.  May need EGD.  Will leave up to GI.  Orders: T-Sed Rate (Automated) [id number]) CXR- 2view (CXR) T-CBC w/Diff ([id number]) Gastroenterology Referral (GI)  Problem # 2:  FATTY LIVER DISEASE (ICD-571.8) He would probably benefit from being on Metformin with A1C of 6.1 and poss. metabolic syndrome. However, I don't know if he is taking any medicines correctly. I think the first thing to do is to try to get him set up with [facility] and have a case manager assess his home situation.  I think he has a poor diet and I do not know if  he is going to be able understand how to take a new medicine.  If he can get set up with [facility], this may help me know that I can manage him better. Repeat labs today to see how he is doing.  Orders: T-Basic Metabolic Panel [id number]) T-Hepatic Function [phone]) T-Lipid Profile [id number]) Gastroenterology Referral (GI)  Problem # 3:  LIVER FUNCTION TESTS, ABNORMAL, HX OF (ICD-V12.2)  Orders: T-Basic Metabolic Panel [id number]) T-Hepatic Function [phone]) T-Lipid Profile [phone]) Gastroenterology Referral (GI)  Problem # 4:  PREVENTIVE HEALTH CARE (ICD-V70.0)  Orders: Gastroenterology Referral (GI)  Problem # 5:  GERD (ICD-530.81) I do not know if he is taking anything. Will have him bring his meds to the nurse. If he is not taking pepcid, restart. If he is, change to Nexium (covered by Medicaid)  His updated medication list for this problem includes:    Pepcid 20 Mg Tabs (Famotidine) .Timothy Odonnell... Take 1 tablet by mouth two times a day as needed for stomach acid  Complete Medication List: 1)  Lopid 600 Mg Tabs (Gemfibrozil) .... Do not take for now 2)  One-daily Multivitamins Tabs (Multiple vitamin) .Timothy Odonnell.. 1 by mouth daily 3)  Benztropine Mesylate 1 Mg Tabs (Benztropine mesylate) .... Take one (1) tablet at bedtime (dr.steele) 4)  Perphenazine 8 Mg Tabs (Perphenazine) .... Take 4 tablets by mouth at bedtime([facility]) 5)  Pravastatin Sodium 20 Mg Tabs (Pravastatin sodium) .... Do not take for now 6)  Pepcid 20 Mg Tabs (Famotidine) .... Take 1 tablet by mouth two times a day as needed for stomach acid 7)  Vitamin D 400 Unit Tabs (Cholecalciferol) .... Take 2 tabs by mouth once daily.  Patient Instructions: 1)  Bring all your medicines and show to [place].  Tell her which ones you are taking and which ones you have stopped. 2)  I am going to set you up with a case manager to help you at home where they can. 3)  Please schedule a follow-up appointment in 1 month with Adeena Bernabe  for liver and weight loss.

## 2010-06-23 NOTE — Procedures (Signed)
Summary: Upper Endoscopy  Patient: Kyshaun Barnette Note: All result statuses are Final unless otherwise noted.  Tests: (1) Upper Endoscopy (EGD)   EGD Upper Endoscopy       DONE     [facility]     [address]     [place], [place]  [address]           ENDOSCOPY PROCEDURE REPORT           PATIENT:  Aron, Inge  MR#:  [id number]     BIRTHDATE:  [birth year], 54 yrs. old  GENDER:  male           ENDOSCOPIST:  Vania Rea. Jarold Motto, MD, [facility]     Referred by:           PROCEDURE DATE:  02/02/2010     PROCEDURE:  EGD with biopsy     ASA CLASS:  Class II     INDICATIONS:  GERD           MEDICATIONS:   There was residual sedation effect present from     prior procedure., Versed 2 mg IV     TOPICAL ANESTHETIC:           DESCRIPTION OF PROCEDURE:   After the risks benefits and     alternatives of the procedure were thoroughly explained, informed     consent was obtained.  The LB [id number] endoscope was     introduced through the mouth and advanced to the second portion of     the duodenum, without limitations.  The instrument was slowly     withdrawn as the mucosa was fully examined.     <<PROCEDUREIMAGES>>           Barrett's esophagus was found. 5 CM. SEGMENT OF BARRETT'S     BIOPSIED.  The stomach was entered and closely examined. The     antrum, angularis, and lesser curvature were well visualized,     including a retroflexed view of the cardia and fundus. The stomach     wall was normally distensable. The scope passed easily through the     pylorus into the duodenum. CLO BX. DONE  The duodenal bulb was     normal in appearance, as was the postbulbar duodenum.     Retroflexed views revealed no masses.    The scope was then withdrawn     from the patient and the procedure completed.           COMPLICATIONS:  None           ENDOSCOPIC IMPRESSION:     1) Barrett's esophagus     2) Normal stomach     3) Normal duodenum     4) No masses     BARRETT'S MUCOSA  FROM CHRONIC GERD.     RECOMMENDATIONS:     1) REPEAT SURVEILLANCE EGD IN 3 YEARS     2) Await biopsy results           REPEAT EXAM:  No           ______________________________     Vania Rea. Jarold Motto, MD, Clementeen Graham           CC:  Tereso Newcomer PA, Julieanne Manson, MD           n.     Rosalie Doctor:   Vania Rea. Patterson at 02/02/2010 03:18 PM           Timmie Foerster, [id number]  Note: An  exclamation mark (!) indicates a result that was not dispersed into the flowsheet. Document Creation Date: 02/02/2010 3:19 PM _______________________________________________________________________  (1) Order result status: Final Collection or observation date-time: 02/02/2010 15:12 Requested date-time:  Receipt date-time:  Reported date-time:  Referring Physician:   Ordering Physician: Sheryn Bison [phone]) Specimen Source:  Source: Launa Grill Order Number: [id number] Lab site:   Appended Document: Upper Endoscopy     Procedures Next Due Date:    EGD: 01/2013

## 2010-06-23 NOTE — Letter (Signed)
Summary: *HSN Results Follow up  [facility]  [address] [place], [place] [address]   Phone: [phone]  Fax: [phone]      01/29/2010   PAARTH CROPPER [address] [place], [place]  [address]   Dear  Mr. York Tetzlaff,                            ____S.Drinkard,FNP   ____D. Gore,FNP       ____B. McPherson,MD   ____V. Rankins,MD    ____E. Mulberry,MD    ____N. Daphine Deutscher, FNP  ____D. Reche Dixon, MD    ____K. Philipp Deputy, MD    ____Other     This letter is to inform you that your recent test(s):  _______Pap Smear    _______Lab Test     _______X-ray    _______ is within acceptable limits  _______ requires a medication change  _______ requires a follow-up lab visit  _______ requires a follow-up visit with your provider   Comments:  We have been trying to reach you.  Please give [place] a call at your earliest convenience.       _________________________________________________________ If you have any questions, please contact our office                     Sincerely,  [place] Shannon [facility]

## 2010-06-23 NOTE — Letter (Signed)
Summary: *Referral Letter  [facility]  [address] [place], [place] [address]   Phone: [phone]  Fax: [phone]    11/28/2009  Thank you in advance for agreeing to see my patient:  Add Dinapoli [address] [place], [place]  [address]  Phone: [phone]  Reason for Referral: 55 yo male with a h/o hyperlipidemia who was noted to have elevated LFTs in the last few months.  Hepatitis serologies were notable for prior Hepatitis B infection with negative Hepatitis B surface antigen.  His Hepatitis C antibody was negative.  His AST has been consistently higher than his ALT and his ALP is elevated along with GGT.  He is followed by psychiatry and has a h/o mental retardation.  He does not admit to significant alcohol consumption at this time.  He reports a prior h/o significant alcohol consumption.  It is not certain how accurate his history is at this time.  His A1C is 6.1 diagnosing him with glucose intolerance.  His abdominal ultrasound demonstrates fatty liver.  With the degree of elevation of his LFTs, I have asked him to stop his fibrate and statin.  His most recent lipid panel demonstrates a triglyceride level of 880.  He describes a poor diet.  He has lost weight over the last several months.  His weight was 235 in April and is now 218.  He does have some early satiety.  He denies blood in stools or melena.  He only has occasional indigestion.  He is in need of screening colonoscopy due to his age.  Of note, stool cards in May were negative x 3.  His recent hemoglobin is normal.  Please evaluate for colonoscopy +/- EGD.  Also, please provide any assessment of his elevated LFTs that you can.  I am currently trying to get him set up with a case worker to ensure his medications are taken correctly.  I am considering placing him on metformin for possible metabolic syndrome.  I am currently hesistant to use a fibrate or statin with the degree of elevation in his LFTs.     Current Medical Problems: 1)  LOSS OF WEIGHT (ICD-783.21) 2)  FATTY LIVER DISEASE (ICD-571.8) 3)  VITAMIN D DEFICIENCY (ICD-268.9) 4)  PREVENTIVE HEALTH CARE (ICD-V70.0) 5)  ELECTROCARDIOGRAM, ABNORMAL (ICD-794.31) 6)  LIVER FUNCTION TESTS, ABNORMAL, HX OF (ICD-V12.2) 7)  PSYCHIATRIC DISORDER (ICD-300.9) 8)  FATIGUE (ICD-780.79) 9)  RETARDATION, MENTAL, MILD (ICD-317) 10)  GERD (ICD-530.81) 11)  OSTEOARTHRITIS (ICD-715.90) 12)  HYPERLIPIDEMIA (ICD-272.4) 13)  HYPERTENSION (ICD-401.9)  Current Medications: 1)  LOPID 600 MG  TABS (GEMFIBROZIL) DO NOT TAKE FOR NOW 2)  ONE-DAILY MULTIVITAMINS   TABS (MULTIPLE VITAMIN) 1 by mouth daily 3)  BENZTROPINE MESYLATE 1 MG TABS (BENZTROPINE MESYLATE) TAKE ONE (1) TABLET AT BEDTIME (Dr.Steele) 4)  PERPHENAZINE 8 MG TABS (PERPHENAZINE) Take 4 tablets by mouth at bedtime([facility]) 5)  PRAVASTATIN SODIUM 20 MG TABS (PRAVASTATIN SODIUM) DO NOT TAKE FOR NOW 6)  PEPCID 20 MG TABS (FAMOTIDINE) Take 1 tablet by mouth two times a day as needed for stomach acid 7)  VITAMIN D 400 UNIT TABS (CHOLECALCIFEROL) Take 2 tabs by mouth once daily.  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 7)  Echo 09/2009:  Normal LVF; no wall motion abnormalities   Thank you again for agreeing to see our patient; please  contact us if you have any further questions or need additional information.  Sincerely,  Tereso Newcomer PA-C

## 2010-06-23 NOTE — Procedures (Signed)
Summary: clotest   

## 2010-06-23 NOTE — Progress Notes (Signed)
Summary: Pt needs endo.Reggie Pile  Phone Note Outgoing Call   Call placed by: Ashok Cordia RN,  January 07, 2010 12:36 PM Summary of Call: Pt needs to be scheudled for endo/colon.  Talked with neghbor  Morris,  Pt has a brother and Langston Masker will have pt's brother call back re sch procedures.  (also needs amonina level drawn) Initial call taken by: Ashok Cordia RN,  January 07, 2010 12:37 PM  Follow-up for Phone Call        Talked with pt's uncle.  Appt sch for previsit and procedures.  Uncle will come with pt on these appts. Follow-up by: Ashok Cordia RN,  January 14, 2010 9:33 AM

## 2010-06-23 NOTE — Letter (Signed)
Summary: *HSN Results Follow up  [facility]  [address] [place], [place] [address]   Phone: [phone]  Fax: [phone]      09/26/2009   TAGG EUSTICE [address] [place], [place]  [address]   Dear  Mr. Tharon Tseng,                            ____S.Drinkard,FNP   ____D. Gore,FNP       ____B. McPherson,MD   ____V. Rankins,MD    ____E. Mulberry,MD    ____N. Daphine Deutscher, FNP  ____D. Reche Dixon, MD    ____K. Philipp Deputy, MD    ____Other     This letter is to inform you that your recent test(s):  _______Pap Smear    _______Lab Test     _______X-ray    _______ is within acceptable limits  _______ requires a medication change  _______ requires a follow-up lab visit  _______ requires a follow-up visit with your provider   Comments:  We have been trying to reach you.  Please give the office a call at your earliest convenience.       _________________________________________________________ If you have any questions, please contact our office                     Sincerely,  [place] Shannon [facility]

## 2010-06-23 NOTE — Letter (Signed)
Summary: New Patient letter  [facility]  [address] [place], [place] [address]   Phone: [phone]  Fax: [phone]       11/28/2009 MRN: [id number]  Timothy Odonnell [address] [place], [place]  [address]  Dear Mr. Wivell,  Welcome to the Gastroenterology Division at [facility].    You are scheduled to see Dr.  Jarold Motto on 01/06/2010 at  8:30am on the 3rd floor at [facility], [address]  We ask that you try to arrive at our office 15 minutes prior to your appointment time to allow for check-in.  We would like you to complete the enclosed self-administered evaluation form prior to your visit and bring it with you on the day of your appointment.  We will review it with you.  Also, please bring a complete list of all your medications or, if you prefer, bring the medication bottles and we will list them.  Please bring your insurance card so that we may make a copy of it.  If your insurance requires a referral to see a specialist, please bring your referral form from your primary care physician.  Co-payments are due at the time of your visit and may be paid by cash, check or credit card.     Your office visit will consist of a consult with your physician (includes a physical exam), any laboratory testing he/she may order, scheduling of any necessary diagnostic testing (e.g. x-ray, ultrasound, CT-scan), and scheduling of a procedure (e.g. Endoscopy, Colonoscopy) if required.  Please allow enough time on your schedule to allow for any/all of these possibilities.    If you cannot keep your appointment, please call [phone] to cancel or reschedule prior to your appointment date.  This allows [place] the opportunity to schedule an appointment for another patient in need of care.  If you do not cancel or reschedule by 5 p.m. the business day prior to your appointment date, you will be charged a $50.00 late cancellation/no-show fee.    Thank you for choosing  [facility] for your medical needs.  We appreciate the opportunity to care for you.  Please visit [place] at our website  to learn more about our practice.                     Sincerely,                                                             The Gastroenterology Division

## 2010-06-23 NOTE — Assessment & Plan Note (Signed)
Summary: physical exam//gk   Vital Signs:  Patient profile:   55 year old male Height:      72.75 inches Weight:      227 pounds BMI:     30.26 Temp:     97.5 degrees F oral Pulse rate:   66 / minute Pulse rhythm:   regular Resp:     18 per minute BP sitting:   137 / 87  (left arm) Cuff size:   large  Vitals Entered By: Armenia Shannon (Sep 26, 2009 9:49 AM) CC: CPE Is Patient Diabetic? No Pain Assessment Patient in pain? no       Does patient need assistance? Functional Status Self care Ambulation Normal   Primary Care Provider:  Tereso Newcomer PA-C  CC:  CPE.  History of Present Illness: Here for CPE.  Elevated LFTs:  Demetries Coia admits to drinking 3 beers most days of the week.  Denies drinking more than that.  Denies drinking wine or spirits.  Hep serologies were neg for A and C and his Hep B was positive for past infection with neg Hep B surface antigen.  Hepatic ultrasound is pending.  He is taking (we think) Lopid and pravastatin.  He has been told to stop these meds.  Psychiatric disorder:  He is not sure what he goes to [facility] for.  He says he hears voices.  Sounds like he may have schizophrenia.  Health maint: PSA done recently. Td up to date. Never has had a colonoscopy. . . discussed with Timmie Foerster and he is willing to proceed. Says he has plenty of sun exposure.   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 Bactrim  Past History:  Past Medical History: Last updated: 10/23/2008 Hypertension Hyperlipidemia Osteoarthritis GERD Mental Retardation ?Brain problem as child.Timmie Foerster says was "tumor" but that "he outgrew it".  Past Surgical History: Last updated: 11/07/2006 Denies surgical history  Family History: emphysema - dad Family History Hypertension - mom  Social History: Reviewed history from 08/26/2009 and no changes required. Occupation:unemployed Single Illiterate. Lives on own in mobile home.Lives near his uncle. He has no phone. He does not drive. Has food stamps.Has running water and electricity. He can walk to pharmacy. Has nephew that helps him also. smokes occ cigar no alcohol no drugs Drug Use:  never Smoking Status:  current Packs/Day:  occ cigar Seat Belt Use:  always  Review of Systems      See HPI General:  Denies chills and fever. CV:  Denies chest pain or discomfort, fainting, and shortness of breath with exertion. Resp:  Denies cough. GI:  Denies bloody stools and dark tarry stools. GU:  Denies dysuria, hematuria, and nocturia. Psych:  Complains of unusual visions or sounds. Endo:  Denies cold intolerance and heat intolerance. Heme:  Denies bleeding.  Physical Exam  General:  alert, well-developed, and well-nourished.   Head:  normocephalic and atraumatic.   Eyes:  pupils equal, pupils round, pupils reactive to light, and no optic disk abnormalities.   Ears:  R ear normal and L ear normal.   Nose:  no external deformity.   Mouth:  pharynx pink and moist, no erythema, and no exudates.   Neck:  supple, no JVD, no carotid bruits, and no cervical lymphadenopathy.   Chest Wall:  no deformities.   Lungs:  normal breath sounds, no crackles, and no wheezes.   Heart:  normal rate, regular rhythm, and no murmur.   Abdomen:  soft,  non-tender, normal bowel sounds, and no hepatomegaly.   Rectal:  no external abnormalities, no hemorrhoids, normal sphincter tone, and no masses.   Genitalia:  circumcised, no hydrocele, no varicocele, no scrotal masses, no testicular masses or atrophy, no cutaneous lesions, and no urethral discharge.   Prostate:  no gland enlargement, no nodules, no asymmetry, and no induration.   Msk:  normal ROM.   Pulses:  R posterior tibial normal, R dorsalis pedis normal, L posterior tibial normal, and L dorsalis pedis normal.   Extremities:  trace left pedal edema and trace right pedal edema.  with some lichenification bilat  Neurologic:  alert & oriented X3, cranial nerves II-XII intact, strength normal in all extremities, and DTRs symmetrical and normal.   Skin:  as above dry skin esp on lower ext noted bilat Psych:  normally interactive.     Impression & Recommendations:  Problem # 1:  LIVER FUNCTION TESTS, ABNORMAL, HX OF (ICD-V12.2) get abd u/s d/c alcohol stop pravastatin and lopid repeat LFTs in 1 month  Problem # 2:  HYPERLIPIDEMIA (ICD-272.4) hold meds as above  His updated medication list for this problem includes:    Lopid 600 Mg Tabs (Gemfibrozil) .Marland Kitchen... Do not take for now    Pravastatin Sodium 20 Mg Tabs (Pravastatin sodium) .Marland Kitchen... Do not take for now  Problem # 3:  HYPERTENSION (ICD-401.9)  BP still below goal on no meds cont to monitor for now  Orders: UA Dipstick w/o Micro (manual) (82956) Gastroenterology Referral (GI)  Problem # 4:  ELECTROCARDIOGRAM, ABNORMAL (ICD-794.31)  ? inf Q waves Albert Hersch does not report any symptoms . . . ? cognitively aware of symptoms  get echo  Orders: 2 D Echo (2 D Echo)  Problem # 5:  Preventive Health Care (ICD-V70.0)  check Vit D, HIV, RPR refer for colo  Orders: T-Vitamin D (25-Hydroxy) (21308-65784) T-HIV Antibody  (Reflex) [phone]) T-Syphilis Test (RPR) (32440-10272) Hemoccult Cards -3 specimans (take home)  (82272) Hemoccult Guaiac-1 spec.(in office) (82270) UA Dipstick w/o Micro (manual) (53664) Gastroenterology Referral (GI)  Problem # 6:  PSYCHIATRIC DISORDER (ICD-300.9) f[facility]u with [facility]  Problem # 7:  RETARDATION, MENTAL, MILD (ICD-317) SCAT forms completed recently so he can start using SCAT  Complete Medication List: 1)  Lopid 600 Mg Tabs (Gemfibrozil) .... Do not take for now 2)  One-daily Multivitamins Tabs (Multiple vitamin) .Marland Kitchen.. 1 by mouth daily 3)  Benztropine Mesylate 1 Mg Tabs (Benztropine mesylate) .... Take one (1) tablet at bedtime (dr.steele) 4)  Perphenazine 8 Mg Tabs (Perphenazine) .... Take 4 tablets by mouth at bedtime(gcmh) 5)  Pravastatin Sodium 20 Mg Tabs (Pravastatin sodium) .... Do not take for now 6)  Pepcid 20 Mg Tabs (Famotidine) .... Take 1 tablet by mouth two times a day  as needed for stomach acid  Patient Instructions: 1)  Stop drinking alcohol (beer). 2)  Stop taking Lopid (gemfibrozil) and Pravachol (pravastatin). 3)  Return to the lab in 4 weeks for LFTs (V12.2). 4)  Follow up with Scott in 3 months for elevated liver enzymes and blood pressure.  Laboratory Results   Urine Tests  Date/Time Received: Sep 26, 2009 10:10 AM   Routine Urinalysis   Glucose: negative   (Normal Range: Negative) Bilirubin: negative   (Normal Range: Negative) Ketone: negative   (Normal Range: Negative) Spec. Gravity: <1.005   (Normal Range: 1.003-1.035) Blood: negative   (Normal Range: Negative) pH: 6.5   (Normal Range: 5.0-8.0) Protein: negative   (Normal Range: Negative) Urobilinogen: 0.2   (Normal Range: 0-1) Nitrite: negative   (Normal Range: Negative) Leukocyte Esterace: negative   (Normal Range: Negative)      Stool - Occult Blood Hemmoccult #1: negative Date: 09/26/2009    EKG  Procedure date:  09/26/2009  Findings:      Normal sinus rhythm with rate of:  63 ? inferior q waves nonspecific IVCD no isch changes     Appended Document: physical  exam//gk/hemoccult results     Allergies: 1)  ! Bactrim   Complete Medication List: 1)  Lopid 600 Mg Tabs (Gemfibrozil) .... Do not take for now 2)  One-daily Multivitamins Tabs (Multiple vitamin) .Marland Kitchen.. 1 by mouth daily 3)  Benztropine Mesylate 1 Mg Tabs (Benztropine mesylate) .... Take one (1) tablet at bedtime (dr.steele) 4)  Perphenazine 8 Mg Tabs (Perphenazine) .... Take 4 tablets by mouth at bedtime(gcmh) 5)  Pravastatin Sodium 20 Mg Tabs (Pravastatin sodium) .... Do not take for now 6)  Pepcid 20 Mg Tabs (Famotidine) .... Take 1 tablet by mouth two times a day as needed for stomach acid 7)  Vitamin D 400 Unit Tabs (Cholecalciferol) .... Take 2 tabs by mouth once daily.   Laboratory Results  Date/Time Received: Oct 10, 2009 2:51 PM   Stool - Occult Blood Hemmoccult #1: negative Date: 10/10/2009 Hemoccult #2: negative Date: 10/10/2009 Hemoccult #3: negative Date: 10/10/2009

## 2010-06-23 NOTE — Miscellaneous (Signed)
  Clinical Lists Changes  Problems: Added new problem of BARRETTS ESOPHAGUS (ICD-530.85) - EGD done 01/2010 Observations: Added new observation of PAST MED HX: Hypertension Hyperlipidemia Osteoarthritis GERD Mental Retardation ?Brain problem as child.Timmie Foerster says was "tumor" but that "he outgrew it". Echo 09/2009:  Normal LVF; no wall motion abnormalities Arthritis sleep apnea Colo 01/2010 . . . normal.  Needs repeat in 2021 EGD 01/2010:  Barret's Esophagus . . . needs repeat in 2014.  (02/02/2010 17:25)       Past History:  Past Medical History: Hypertension Hyperlipidemia Osteoarthritis GERD Mental Retardation ?Brain problem as child.Timmie Foerster says was "tumor" but that "he outgrew it". Echo 09/2009:  Normal LVF; no wall motion abnormalities Arthritis sleep apnea Colo 01/2010 . . . normal.  Needs repeat in 2021 EGD 01/2010:  Barret's Esophagus . . . needs repeat in 2014.

## 2010-06-23 NOTE — Letter (Signed)
Summary: *HSN Results Follow up  [facility]  [address] [place], [place] [address]   Phone: [phone]  Fax: [phone]      12/29/2009   Timothy Odonnell [address] [place], [place]  [address]   Dear  Mr. Timothy Odonnell,                            ____S.Drinkard,FNP   ____D. Gore,FNP       ____B. McPherson,MD   ____V. Rankins,MD    ____E. Mulberry,MD    ____N. Daphine Deutscher, FNP  ____D. Reche Dixon, MD    ____K. Philipp Deputy, MD    ____Other     This letter is to inform you that your recent test(s):  _______Pap Smear    _______Lab Test     _______X-ray    _______ is within acceptable limits  _______ requires a medication change  _______ requires a follow-up lab visit  _______ requires a follow-up visit with your provider   Comments:  We have been trying to reach you.  Please give the office a call at your earliest convenience.       _________________________________________________________ If you have any questions, please contact our office                     Sincerely,  Armenia Shannon [facility]

## 2010-06-23 NOTE — Letter (Signed)
Summary: Patient Notice-Barrett's [facility]  [address] [place], [place] [address]   Phone: [phone]  Fax: [phone]        February 05, 2010 MRN: [id number]    Timothy Odonnell [address] [place], [place]  [address]    Dear [name],  I am pleased to inform you that the biopsies taken during your recent endoscopic examination did not show any evidence of cancer upon pathologic examination.  However, your biopsies indicate you have a condition known as Barrett's esophagus. While not cancer, it is pre-cancerous (can progress to cancer) and needs to be monitored with repeat endoscopic examination and biopsies.  Fortunately, it is quite rare that this develops into cancer, but careful monitoring of the condition along with taking your medication as prescribed is important in reducing the risk of developing cancer.  It is my recommendation that you have a repeat upper gastrointestinal endoscopic examination in 3_ years.  Additional information/recommendations:  __Please call [phone] to schedule a return visit to further      evaluate your condition.  x__Continue with treatment plan as outlined the day of your exam.  Please call us if you have or develop heartburn, reflux symptoms, any swallowing problems, or if you have questions about your condition that have not been fully answered at this time.  Sincerely,  Mardella Layman MD [facility]  This letter has been electronically signed by your physician.  Appended Document: Patient Notice-Barrett's Esopghagus letter mailed

## 2010-06-23 NOTE — Progress Notes (Signed)
  Phone Note Outgoing Call   Summary of Call: have patient start Lovaza 1 gram by mouth two times a day  repeat LFTs and FLP in 6 weeks rx in basket to fax along with PA form for medicaid   Initial call taken by: Tereso Newcomer PA-C,  December 23, 2009 9:41 PM  Follow-up for Phone Call        tried calling no answer  called [phone] no answer no VM. called contact # [phone] does not have voicemail box set-up. Gaylyn Cheers RN  December 26, 2009 9:56 AM  Follow-up by: Armenia Shannon,  December 25, 2009 10:25 AM  Additional Follow-up for Phone Call Additional follow up Details #1::        tried calling pt but no answer .Marland KitchenMarland KitchenArmenia Shannon  December 26, 2009 3:52 PM   tried calling pt but no answer ...will mail letter.... Armenia Shannon  December 29, 2009 12:27 PM     New/Updated Medications: LOVAZA 1 GM CAPS (OMEGA-3-ACID ETHYL ESTERS) Take 1 capsule by mouth two times a day Prescriptions: LOVAZA 1 GM CAPS (OMEGA-3-ACID ETHYL ESTERS) Take 1 capsule by mouth two times a day  #60 x 5   Entered and Authorized by:   Tereso Newcomer PA-C   Signed by:   Tereso Newcomer PA-C on 12/23/2009   Method used:   Printed then faxed to ...       [id number] (retail)       [address]       [place], [place]  [address]       Ph: [phone] or [phone]       Fax: [phone]   RxID:   [id number]     Impression & Recommendations:  Problem # 1:  HYPERLIPIDEMIA (ICD-272.4) with elevated LFTs, continue to hold fibrate and statin will try Lovaza Medicaid should cover with trigs > 500  His updated medication list for this problem includes:    Lopid 600 Mg Tabs (Gemfibrozil) .Marland Kitchen... Do not take for now    Pravastatin Sodium 20 Mg Tabs (Pravastatin sodium) .Marland Kitchen... Do not take for now    Lovaza 1 Gm Caps (Omega-3-acid ethyl esters) .Marland Kitchen... Take 1 capsule by mouth two times a day  Complete Medication List: 1)  Lopid 600 Mg Tabs (Gemfibrozil) .... Do not take for now 2)  One-daily Multivitamins Tabs  (Multiple vitamin) .Marland Kitchen.. 1 by mouth daily 3)  Benztropine Mesylate 1 Mg Tabs (Benztropine mesylate) .... Take one (1) tablet at bedtime (dr.steele) 4)  Perphenazine 8 Mg Tabs (Perphenazine) .... Take 4 tablets by mouth at bedtime(gcmh) 5)  Pravastatin Sodium 20 Mg Tabs (Pravastatin sodium) .... Do not take for now 6)  Pepcid 20 Mg Tabs (Famotidine) .... Take 1 tablet by mouth two times a day as needed for stomach acid 7)  Vitamin D 400 Unit Tabs (Cholecalciferol) .... Take 2 tabs by mouth once daily. 8)  Lovaza 1 Gm Caps (Omega-3-acid ethyl esters) .... Take 1 capsule by mouth two times a day

## 2010-06-23 NOTE — Letter (Signed)
Summary: Patient Notice-Barrett's EsopghagusPlease delete letter  [facility]  [address] [place], [place] [address]   Phone: [phone]  Fax: [phone]        March 10, 2010 MRN: [id number]    DORN HARTSHORNE [address] [place], [place]  [address]    Dear Mr. Buell,  I am pleased to inform you that the biopsies taken during your recent endoscopic examination did not show any evidence of cancer upon pathologic examination.  However, your biopsies indicate you have a condition known as Barrett's esophagus. While not cancer, it is pre-cancerous (can progress to cancer) and needs to be monitored with repeat endoscopic examination and biopsies.  Fortunately, it is quite rare that this develops into cancer, but careful monitoring of the condition along with taking your medication as prescribed is important in reducing the risk of developing cancer.  It is my recommendation that you have a repeat upper gastrointestinal endoscopic examination in 3_ years.Biopsies were negative for H. pylori.  Additional information/recommendations:  __Please call [phone] to schedule a return visit to further      evaluate your condition.  x__Continue with treatment plan as outlined the day of your exam.  Please call us if you have or develop heartburn, reflux symptoms, any swallowing problems, or if you have questions about your condition that have not been fully answered at this time.  Sincerely,  Mardella Layman MD [facility]  This letter has been electronically signed by your physician.

## 2010-06-23 NOTE — Progress Notes (Signed)
Summary: [facility] NEEDS NOTE FROM PROVIDER  Phone Note Call from Patient Call back at Home Phone [phone]   Summary of Call: Lydon Vansickle PT. SPOKE WITH MR Kinnett THIS MORNING AND I TOLD HIM I WOULD CALL [facility] ABOUT HIS FORM THAT WAS SIGNED BY YOU. THE LAD AT [employer] SAYS THAT ON PAGE 6 AND 7 OF THE FORMS, ITS AKSING FOR A NOTE FROM THE PROVIDER STATING THE PATIENT DIAGNOSIS AND THEY NEED TO KNOW TO WHAT DEGREE IS HIS  MENTAL ILLNESS. THIS PRETTY MUCH LETS THEM KNOW IF THEY ARE AT LEAST CAPABLE OR NOT CAPABLE OF CALLING TO SET UP THEIR ON RIDE.Marland Kitchen  AFTER THE NOTE IS READY, WE CAN FAX IT ALONG WITH PAGE 6 & 7 TO [phone]. Initial call taken by: Leodis Rains,  September 09, 2009 12:07 PM  Follow-up for Phone Call        forward to provider Follow-up by: Armenia Shannon,  September 09, 2009 2:16 PM  Additional Follow-up for Phone Call Additional follow up Details #1::        letter done and in your basket Additional Follow-up by: Brynda Rim,  September 19, 2009 5:02 PM

## 2010-06-23 NOTE — Letter (Signed)
Summary: *HSN Results Follow up  [facility]  [address] [place], [place] [address]   Phone: [phone]  Fax: [phone]      12/16/2009   [name] [address] [place], [place]  [address]   Dear  Mr. Timothy Odonnell,                            ____S.Drinkard,FNP   ____D. Gore,FNP       ____B. McPherson,MD   ____V. Rankins,MD    ____E. Mulberry,MD    ____N. Daphine Deutscher, FNP  ____D. Reche Dixon, MD    ____K. Philipp Deputy, MD    ____Other     This letter is to inform you that your recent test(s):  _______Pap Smear    _______Lab Test     _______X-ray    _______ is within acceptable limits  _______ requires a medication change  _______ requires a follow-up lab visit  _______ requires a follow-up visit with your provider   Comments:  We have been trying to reach you.  Please give the office a call at your earliest convenience.       _________________________________________________________ If you have any questions, please contact our office                     Sincerely,  [place] Shannon [facility]

## 2010-06-23 NOTE — Miscellaneous (Signed)
Summary: LFTs pending in 6.2011    Clinical Lists Changes  Problems: Assessed LIVER FUNCTION TESTS, ABNORMAL, HX OF as comment only -  Medicaid has denied abdominal ultrasound. Will try to get u/s again after f/u labs in one month if LFTs still elevated. Chol meds were to have been stopped.       Impression & Recommendations:  Problem # 1:  LIVER FUNCTION TESTS, ABNORMAL, HX OF (ICD-V12.2)  Medicaid has denied abdominal ultrasound. Will try to get u/s again after f/u labs in one month if LFTs still elevated. Chol meds were to have been stopped.  Complete Medication List: 1)  Lopid 600 Mg Tabs (Gemfibrozil) .... Do not take for now 2)  One-daily Multivitamins Tabs (Multiple vitamin) .Marland Kitchen.. 1 by mouth daily 3)  Benztropine Mesylate 1 Mg Tabs (Benztropine mesylate) .... Take one (1) tablet at bedtime (dr.steele) 4)  Perphenazine 8 Mg Tabs (Perphenazine) .... Take 4 tablets by mouth at bedtime(gcmh) 5)  Pravastatin Sodium 20 Mg Tabs (Pravastatin sodium) .... Do not take for now 6)  Pepcid 20 Mg Tabs (Famotidine) .... Take 1 tablet by mouth two times a day as needed for stomach acid 7)  Vitamin D 400 Unit Tabs (Cholecalciferol) .... Take 2 tabs by mouth once daily.

## 2010-06-23 NOTE — Letter (Signed)
Summary: MED/SOLUTIONS   MED/SOLUTIONS   Imported By: Arta Bruce 12/01/2009 10:01:31  _____________________________________________________________________  External Attachment:    Type:   Image     Comment:   External Document

## 2010-06-23 NOTE — Assessment & Plan Note (Signed)
Summary: refills request/gk   Vital Signs:  Patient profile:   55 year old male Height:      72.75 inches Weight:      235 pounds BMI:     31.33 Temp:     97.8 degrees F oral Pulse rate:   98 / minute Pulse rhythm:   regular Resp:     18 per minute BP sitting:   122 / 77  (left arm) Cuff size:   large  Vitals Entered By: Armenia Shannon (August 26, 2009 3:53 PM) CC: pt here for refill on meds.... pt says he is on BP med but did not know which one he takes... pt did not bring meds.. , Hypertension Management, Abdominal Pain Is Patient Diabetic? No Pain Assessment Patient in pain? no       Does patient need assistance? Functional Status Self care Ambulation Normal   Primary Care Provider:  Tereso Newcomer PA-C  CC:  pt here for refill on meds.... pt says he is on BP med but did not know which one he takes... pt did not bring meds.. , Hypertension Management, and Abdominal Pain.  History of Present Illness: Goes to [facility]. Not sure what he goes for.  This is my first meeting with patient. Says he hears voices.  Mental Retardation listed in problem list. Was told to f/u with [place] due to high BP. Has not been seen in a while. Did not bring medications with him. He only takes what he has from [facility] [place]. Dr. Barbaraann Barthel had him on HCTZ at one time.  He did not fill this and had normal BPs.  The med was stopped. He is also supposed to be on Lopid.  Not certain he is taking this. Has GERD.  Not taking any meds.  NO melena or hematochezia.  No hematemesis. History s/w limited due to patient's cognitive ability.   Dyspepsia History:      There is a prior history of GERD.  An H-2 blocker medication is currently being taken.    Hypertension History:      He denies chest pain, dyspnea with exertion, and syncope.        Positive major cardiovascular risk factors include male age 34 years old or older, hyperlipidemia, and hypertension.      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 Bactrim  Past History:  Past Medical History: Last updated: 10/23/2008 Hypertension Hyperlipidemia Osteoarthritis GERD Mental Retardation ?Brain problem as child.Timmie Foerster says was "tumor" but that "he outgrew it".  Social History: Occupation:unemployed Single Illiterate. Lives on own in mobile home.Lives near his uncle. He has no phone. He does not drive. Has food stamps.Has running water and electricity. He can walk to pharmacy. Has nephew that helps him also. smokes occ cigar no alcohol no drugs  Physical Exam  General:  alert, well-developed, and well-nourished.   Head:  normocephalic and atraumatic.   Neck:  supple, no thyromegaly, no carotid bruits, and no cervical lymphadenopathy.   Lungs:  normal breath sounds, no crackles, and no wheezes.   Heart:  normal rate and regular rhythm.   Abdomen:  soft, non-tender, and no hepatomegaly.   Extremities:  no edema  Neurologic:  alert & oriented X3 and cranial nerves II-XII intact.  Psych:  normally interactive.     Impression & Recommendations:  Problem # 1:  PSYCHIATRIC DISORDER (ICD-300.9) f/u with [facility]  Problem # 2:  HYPERTENSION (ICD-401.9)  normotensive on no BP meds not sure about this dx  Orders: UA Dipstick w/o Micro (manual) ([address])  Problem # 3:  HYPERLIPIDEMIA (ICD-272.4) not taking any meds will have him bring in meds to clarify arrange FLP  His updated medication list for this problem includes:    Lopid 600 Mg Tabs (Gemfibrozil) .Marland Kitchen... 1 by mouth two times a day    Pravastatin Sodium 20 Mg Tabs (Pravastatin sodium) .Marland Kitchen... Take 1 tab by mouth at bedtime  (pharmacy, note med change due to medicaid coverage . . . lisinopril d/c'd)  Problem # 4:  Preventive Health  Care (ICD-V70.0) will bring in for labs d/w patient PSA and he wants to get will arrange CPE  Problem # 5:  GERD (ICD-530.81) start pepcid  The following medications were removed from the medication list:    Omeprazole 40 Mg Cpdr (Omeprazole) .Marland Kitchen... Take 1 tab by mouth at bedtime His updated medication list for this problem includes:    Pepcid 20 Mg Tabs (Famotidine) .Marland Kitchen... Take 1 tablet by mouth two times a day as needed for stomach acid  Complete Medication List: 1)  Lopid 600 Mg Tabs (Gemfibrozil) .Marland Kitchen.. 1 by mouth two times a day 2)  One-daily Multivitamins Tabs (Multiple vitamin) .Marland Kitchen.. 1 by mouth daily 3)  Benztropine Mesylate 1 Mg Tabs (Benztropine mesylate) .... Take one (1) tablet at bedtime (dr.steele) 4)  Perphenazine 8 Mg Tabs (Perphenazine) .... Take 4 tablets by mouth at bedtime(gcmh) 5)  Pravastatin Sodium 20 Mg Tabs (Pravastatin sodium) .... Take 1 tab by mouth at bedtime  (pharmacy, note med change due to medicaid coverage . . . lisinopril d/c'd) 6)  Pepcid 20 Mg Tabs (Famotidine) .... Take 1 tablet by mouth two times a day as needed for stomach acid  Hypertension Assessment/Plan:      The patient's hypertensive risk group is category B: At least one risk factor (excluding diabetes) with no target organ damage.  His calculated 10 year risk of coronary heart disease is 7 %.  Today's blood pressure is 122/77.    Patient Instructions: 1)  Arrange lab visit in the next 2 weeks (fasting): 2)  CMET, Lipids, CBC, TSH, PSA, HIV (V70.0, 272.4) 3)  Make sure you come fasting (nothing to eat or drink after midnight the night before except water). 4)  Bring medicines in to lab visit. 5)  Schedule CPE with Braylynn Lewing in one month. Prescriptions: PEPCID 20 MG TABS (FAMOTIDINE) Take 1 tablet by mouth two times a day as needed for stomach acid  #60 x 5   Entered and Authorized by:   Tereso Newcomer PA-C   Signed by:   Tereso Newcomer PA-C on 08/26/2009   Method used:   Print then Give to Patient    RxID:   [id number]

## 2010-06-23 NOTE — Letter (Signed)
Summary: *Referral Letter  [facility]  [address] [place], [place] [address]   Phone: [phone]  Fax: [phone]    09/19/2009  [name] Transit Services Specialist GTA, [facility] Certification [place], [place]  [address] Fax [phone]  Dear Ms High:  This letter is in regards to my patient, Timothy Odonnell (DOB February 05, 1956).  I just met Mr. Porcaro about a month ago for the first time.  He was followed by another provider in this office who has since left the practice.    His medical problems are listed below.  He carries a diagnosis of psychiatric disorder.  He is followed at the [facility] and I do not have access to those records.  Therefore, I cannot elaborate on his condition.  I do believe that they follow him closely and are responsible for getting him his psychiatric medications.  He also carries a diagnosis of mental retardation.  It is my understanding that he is illiterate and does not drive.  In light of his mental health problems and his inability to read, I do not believe that he would be able to effectively navigate a bus schedule on his own.  I do believe he would benefit from receiving services through [facility].  If I can be of further assistance, please do not hesitate to contact me.  Current Medical Problems: 1)  LIVER FUNCTION TESTS, ABNORMAL, HX OF (ICD-V12.2) 2)  PSYCHIATRIC DISORDER (ICD-300.9) 3)  FATIGUE (ICD-780.79) 4)  RETARDATION, MENTAL, MILD (ICD-317) 5)  GERD (ICD-530.81) 6)  OSTEOARTHRITIS (ICD-715.90) 7)  HYPERLIPIDEMIA (ICD-272.4) 8)  HYPERTENSION (ICD-401.9)  Past Medical History: 1)  Hypertension 2)  Hyperlipidemia 3)  Osteoarthritis 4)  GERD 5)  Mental Retardation 6)  ?Brain problem as child.Timmie Foerster says was "tumor" but that "he outgrew it".   Sincerely,  Tereso Newcomer PA-C

## 2010-06-23 NOTE — Miscellaneous (Signed)
Summary: PA approval- Lovaza 1 gm. x1 year 12/23/09-12/23/10  Clinical Lists Changes  PA approval- Lovaza 1 gm. x1 year 12/23/09-12/23/10  Medications: Changed medication from LOVAZA 1 GM CAPS (OMEGA-3-ACID ETHYL ESTERS) Take 1 capsule by mouth two times a day to LOVAZA 1 GM CAPS (OMEGA-3-ACID ETHYL ESTERS) Take 1 capsule by mouth two times a day

## 2010-06-23 NOTE — Letter (Signed)
Summary: ECHO   ECHO   Imported By: Arta Bruce 10/06/2009 10:18:19  _____________________________________________________________________  External Attachment:    Type:   Image     Comment:   External Document

## 2010-06-23 NOTE — Letter (Signed)
Summary: CARE MANAGEMENT PROGRESS SUMMARY  CARE MANAGEMENT PROGRESS SUMMARY   Imported By: Arta Bruce 02/23/2010 12:39:00  _____________________________________________________________________  External Attachment:    Type:   Image     Comment:   External Document

## 2010-06-23 NOTE — Miscellaneous (Signed)
Summary: [facility] Previsit/prep  Clinical Lists Changes  Medications: Added new medication of COLYTE WITH FLAVOR PACKS 240 GM  SOLR (PEG 3350-KCL-NABCB-NACL-NASULF) As per prep instructions. - Signed Added new medication of DULCOLAX 5 MG  TBEC (BISACODYL) Day before procedure take 2 at 12noon. - Signed Added new medication of METOCLOPRAMIDE HCL 10 MG  TABS (METOCLOPRAMIDE HCL) As per prep instructions. - Signed Rx of COLYTE WITH FLAVOR PACKS 240 GM  SOLR (PEG 3350-KCL-NABCB-NACL-NASULF) As per prep instructions.;  #1 x 0;  Signed;  Entered by: Wyona Almas RN;  Authorized by: Mardella Layman MD [facility];  Method used: [facility] to [facility] [phone]*, [address] [place], [place]  [address], Ph: [phone] or [phone], Fax: [phone] Rx of DULCOLAX 5 MG  TBEC (BISACODYL) Day before procedure take 2 at 12noon.;  #2 x 0;  Signed;  Entered by: Wyona Almas RN;  Authorized by: Mardella Layman MD [facility];  Method used: Electronically to [facility] [phone]*, [address], [place], [place]  [address], Ph: [phone] or [phone], Fax: [phone] Rx of METOCLOPRAMIDE HCL 10 MG  TABS (METOCLOPRAMIDE HCL) As per prep instructions.;  #2 x 0;  Signed;  Entered by: Wyona Almas RN;  Authorized by: Mardella Layman MD [facility];  Method used: Electronically to [facility] [phone]*, [address] [place], [place]  [address], Ph: [phone] or [phone], Fax: [phone] Observations: Added new observation of ALLERGY REV: Done (01/20/2010 16:22)    Prescriptions: METOCLOPRAMIDE HCL 10 MG  TABS (METOCLOPRAMIDE HCL) As per prep instructions.  #2 x 0   Entered by:   Wyona Almas RN   Authorized by:   Mardella Layman MD [facility]   Signed by:   Wyona Almas RN on 01/20/2010   Method used:   Electronically to        [facility] [phone]* (retail)       [address]       [place], [place]  [address]       Ph: [phone] or [phone]       Fax: [phone]   RxID:   [id number] DULCOLAX 5 MG   TBEC (BISACODYL) Day before procedure take 2 at 12noon.  #2 x 0   Entered by:   Wyona Almas RN   Authorized by:   Mardella Layman MD [facility]   Signed by:   Wyona Almas RN on 01/20/2010   Method used:   Electronically to        [facility] [phone]* (retail)       [address]       [place], [place]  [address]       Ph: [phone] or [phone]       Fax: [phone]   RxID:   [id number] COLYTE WITH FLAVOR PACKS 240 GM  SOLR (PEG 3350-KCL-NABCB-NACL-NASULF) As per prep instructions.  #1 x 0   Entered by:   Wyona Almas RN   Authorized by:   Mardella Layman MD [facility]   Signed by:   Wyona Almas RN on 01/20/2010   Method used:   Electronically to        [facility] [phone]* (retail)       [address]       [place], [place]  [address]       Ph: [phone] or [phone]       Fax: [phone]   RxID:   [id number]

## 2010-06-23 NOTE — Assessment & Plan Note (Signed)
Summary: F/U PER [facility], repeat FLP and LFTs ///RJP   Vital Signs:  Patient profile:   55 year old male Height:      72.75 inches Weight:      215 pounds BMI:     28.66 Temp:     98.5 degrees F oral Pulse rate:   80 / minute Pulse rhythm:   regular Resp:     18 per minute BP sitting:   110 / 80  (left arm) Cuff size:   large  Vitals Entered By: Arm[place] Shannon (January 22, 2010 8:31 AM) CC: f/u... Is Patient Diabetic? No Pain Assessment Patient in pain? no       Does patient need assistance? Functional Status Self care Ambulation Normal   Primary Care Provider:  Tereso Newcomer PA-C  CC:  f/u....  History of Present Illness: Timothy Odonnell returns for followup.  He has seen gastroenterology since I last saw him.  It is felt that he has alcoholic hepatitis.  AFP was unremarkable.  He is to be set up for an EGD and colonoscopy.  The patient has attended the previsit session.  He seems to be motivated to go to the appointment.  I have been working with case management.  They have been unable to reach the patient.  The patient does not have a telephone.  He states that he has to reach into his uncle.  I did speak to the gastroenterologist the electronic correspondence.  He felt that his cholesterol medication should be okay to use at this point in time.  The patient denies any nausea vomiting.  He denies any further weight loss.  He thinks his appetite is good.  He denies fevers or chills.  He denies any chest pain or shortness of breath.  The patient is vague about his alcohol consumption.  He notes that he drinks a beer or 2 a day.  However, upon further questioning it seems that he probably drinks 3 cans at 24 ounces each of beer most days of the week if not every day.  I want him seen here by our counselor to discuss alcohol cessation.  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 Do Not Take For Now 2)  One-Daily Multivitamins   Tabs (Multiple Vitamin) .Marland Kitchen.. 1 By Mouth Daily 3)  Benztropine Mesylate 1 Mg Tabs (Benztropine Mesylate) .... Take One (1) Tablet At Bedtime (Dr.steele) 4)  Perphenazine 8 Mg Tabs (Perphenazine) .... Take 4 Tablets By Mouth At Bedtime([facility]) 5)  Pravastatin Sodium 20 Mg Tabs (Pravastatin Sodium) .... Do Not Take For Now 6)  Pepcid 20 Mg Tabs (Famotidine) .... Take 1 Tablet By Mouth Two Times A Day As Needed For Stomach Acid 7)  Vitamin D 400 Unit Tabs (Cholecalciferol) .... Take 2 Tabs By Mouth Once Daily. 8)  Lovaza 1 Gm Caps (Omega-3-Acid Ethyl Esters) .... Take 1 Capsule By Mouth Two Times A Day 9)  Aciphex 20 Mg  Tbec (Rabeprazole Sodium) .... Take 1 Each Day 30 Minutes Before Meals 10)  Colyte With Flavor Packs 240 Gm  Solr (Peg 3350-Kcl-Nabcb-Nacl-Nasulf) .... As Per Prep Instructions. 11)  Dulcolax 5 Mg  Tbec (Bisacodyl) .... Day Before Procedure Take 2 At 12noon. 12)  Metoclopramide Hcl 10 Mg  Tabs (Metoclopramide Hcl) .... As Per Prep Instructions.  Allergies (verified): 1)  ! Bactrim  Past History:  Past Medical History: Last updated: 01/06/2010 Hypertension Hyperlipidemia Osteoarthritis GERD Mental Retardation ?Brain problem as child.Timmie Foerster says was "tumor" but that "he outgrew it". Echo 09/2009:  Normal LVF; no wall motion abnormalities Arthritis sleep apnea  Past Surgical History: Last updated: 11/07/2006 Denies  surgical history  Social History: Last updated: 01/06/2010 Occupation:unemployed/ disability Single Illiterate. Lives on own in mobile home.Lives near his uncle. He has no phone. He does not drive. Has food stamps.Has running water and electricity. He can walk to pharmacy. Has nephew that helps him also. smokes occ cigar no drugs Alcohol Use - yes   1-3 beers per day Illicit Drug Use - no  Physical Exam  General:  alert, well-developed, and well-nourished.   Head:  normocephalic and atraumatic.   Eyes:  non icteric Neck:  supple and no cervical lymphadenopathy.   Lungs:  normal breath sounds.   Heart:  normal rate and regular rhythm.   Abdomen:  soft, non-tender, and no hepatomegaly.   Extremities:  no edema  Neurologic:  cranial nerves II-XII intact.   Skin:  no telangiectasia no caput medussa  Psych:  normally interactive.     Impression & Recommendations:  Problem # 1:  ALCOHOLIC HEPATITIS (ICD-571.1)  counseling for alcohol Recheck complete metabolic panel today  Orders: Psychology Referral (Psychology)  Problem # 2:  HYPERLIPIDEMIA (ICD-272.4)  continue Levoxyl Continue off of pravastatin Restart TriCor Check lipids and LFTs in 6 weeks  The following medications were removed from the medication list:    Lopid 600 Mg Tabs (Gemfibrozil) .Marland Kitchen... Do not take for now His updated medication list for this problem includes:    Pravastatin Sodium 20 Mg Tabs (Pravastatin sodium) .Marland Kitchen... Do not take for now    Lovaza 1 Gm Caps (Omega-3-acid ethyl esters) .Marland Kitchen... Take 1 capsule by mouth two times a day    Tricor 48 Mg Tabs (Fenofibrate) .Marland Kitchen... Take 1 tablet by mouth once a day  Orders: T-Comprehensive Metabolic Panel ([address])  Problem # 3:  LOSS OF WEIGHT (ICD-783.21) weight stable likely related to poor diet still trying to get [facility] involved I have called case mgr again today  Problem # 4:  FATTY LIVER DISEASE (ICD-571.8) one medicine at a time will feel  better about considering metformin if case mgr involved follow for now  Problem # 5:  GERD (ICD-530.81) aciphex added by GI  His updated medication list for this problem includes:    Pepcid 20 Mg Tabs (Famotidine) .Marland Kitchen... Take 1 tablet by mouth two times a day as needed for stomach acid    Aciphex 20 Mg Tbec (Rabeprazole sodium) .Marland Kitchen... Take 1 each day 30 minutes before meals  Complete Medication List: 1)  One-daily Multivitamins Tabs (Multiple vitamin) .Marland Kitchen.. 1 by mouth daily 2)  Benztropine Mesylate 1 Mg Tabs (Benztropine mesylate) .... Take one (1) tablet at bedtime (dr.steele) 3)  Perphenazine 8 Mg Tabs (Perphenazine) .... Take 4 tablets by mouth at bedtime(g[facility]) 4)  Pravastatin Sodium 20 Mg Tabs (Pravastatin sodium) .... Do not take for now 5)  Pepcid 20 Mg Tabs (  Famotidine) .... Take 1 tablet by mouth two times a day as needed for stomach acid 6)  Vitamin D 400 Unit Tabs (Cholecalciferol) .... Take 2 tabs by mouth once daily. 7)  Lovaza 1 Gm Caps (Omega-3-acid ethyl esters) .... Take 1 capsule by mouth two times a day 8)  Aciphex 20 Mg Tbec (Rabeprazole sodium) .... Take 1 each day 30 minutes before meals 9)  Colyte With Flavor Packs 240 Gm Solr (Peg 3350-kcl-nabcb-nacl-nasulf) .... As per prep instructions. 10)  Dulcolax 5 Mg Tbec (Bisacodyl) .... Day before procedure take 2 at 12noon. 11)  Metoclopramide Hcl 10 Mg Tabs (Metoclopramide hcl) .... As per prep instructions. 12)  Tricor 48 Mg Tabs (Fenofibrate) .... Take 1 tablet by mouth once a day  Patient Instructions: 1)  Lab visit: Complete metabolic panel, fasting lipid panel in 6 weeks. 2)  DO NOT TAKE PRAVASTATIN. 3)  DO NOT TAKE LOPID. 4)  I have given you a new prescription for Tricor for your cholesterol. Take this once daily. 5)  You need to get labs drawn in 6 weeks. 6)  Schedule follow up with Scott in 2 months. 7)  Schedule appt with Ethelene Browns. Prescriptions: TRICOR 48 MG TABS (FENOFIBRATE) Take 1 tablet by mouth  once a day  #30 x 2   Entered and Authorized by:   Tereso Newcomer PA-C   Signed by:   Tereso Newcomer PA-C on 01/22/2010   Method used:   Print then Give to Patient   RxID:   [id number]

## 2010-08-13 LAB — TRICYCLICS SCREEN, URINE: TCA Scrn: NOT DETECTED

## 2010-08-13 LAB — DIFFERENTIAL
Basophils Absolute: 0 10*3/uL (ref 0.0–0.1)
Eosinophils Relative: 2 % (ref 0–5)
Lymphocytes Relative: 32 % (ref 12–46)
Neutro Abs: 3.7 10*3/uL (ref 1.7–7.7)

## 2010-08-13 LAB — CBC
Platelets: 266 10*3/uL (ref 150–400)
RDW: 13.8 % (ref 11.5–15.5)

## 2010-08-13 LAB — BASIC METABOLIC PANEL
BUN: 10 mg/dL (ref 6–23)
Calcium: 8.7 mg/dL (ref 8.4–10.5)
Creatinine, Ser: 0.9 mg/dL (ref 0.4–1.5)
GFR calc non Af Amer: >60 mL/min (ref >60)
Glucose, Bld: 101 mg/dL — ABNORMAL HIGH (ref 70–99)

## 2010-08-13 LAB — RAPID URINE DRUG SCREEN, HOSP PERFORMED
Benzodiazepines: NOT DETECTED
Cocaine: NOT DETECTED
Tetrahydrocannabinol: NOT DETECTED
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
## 2011-01-26 ENCOUNTER — Emergency Department (HOSPITAL_COMMUNITY)
Admission: EM | Admit: 2011-01-26 | Discharge: 2011-01-27 | Disposition: A | Discharge status: Home / Self Care | Payer: Self-pay | Attending: Emergency Medicine | Admitting: Emergency Medicine

## 2011-01-26 DIAGNOSIS — R45850 Homicidal ideations: Secondary | ICD-10-CM | POA: Insufficient documentation

## 2011-01-26 DIAGNOSIS — I10 Essential (primary) hypertension: Secondary | ICD-10-CM | POA: Insufficient documentation

## 2011-01-26 LAB — DIFFERENTIAL
Eosinophils Absolute: 0.1 10*3/uL (ref 0.0–0.7)
Lymphocytes Relative: 27 % (ref 12–46)
Lymphs Abs: 1.3 10*3/uL (ref 0.7–4.0)
Monocytes Relative: 9 % (ref 3–12)
Neutro Abs: 3 10*3/uL (ref 1.7–7.7)
Neutrophils Relative %: 61 % (ref 43–77)

## 2011-01-26 LAB — CBC
Hemoglobin: 13.5 g/dL (ref 13.0–17.0)
MCH: 30.2 pg (ref 26.0–34.0)
MCV: 88.1 fL (ref 78.0–100.0)
Platelets: 188 10*3/uL (ref 150–400)
RBC: 4.47 MIL/uL (ref 4.22–5.81)
WBC: 5 10*3/uL (ref 4.0–10.5)

## 2011-01-26 LAB — RAPID URINE DRUG SCREEN, HOSP PERFORMED
Barbiturates: NOT DETECTED
Opiates: NOT DETECTED
Tetrahydrocannabinol: NOT DETECTED

## 2011-01-26 LAB — COMPREHENSIVE METABOLIC PANEL
ALT: 22 U/L (ref 0–53)
AST: 53 U/L — ABNORMAL HIGH (ref 0–37)
CO2: 23 mEq/L (ref 19–32)
Chloride: 100 mEq/L (ref 96–112)
Creatinine, Ser: 0.81 mg/dL (ref 0.50–1.35)
GFR calc Af Amer: >60 mL/min (ref >60)
GFR calc non Af Amer: >60 mL/min (ref >60)
Glucose, Bld: 101 mg/dL — ABNORMAL HIGH (ref 70–99)
Total Bilirubin: 0.2 mg/dL — ABNORMAL LOW (ref 0.3–1.2)

## 2012-07-30 ENCOUNTER — Emergency Department (HOSPITAL_BASED_OUTPATIENT_CLINIC_OR_DEPARTMENT_OTHER): Payer: Medicaid Other

## 2012-07-30 ENCOUNTER — Emergency Department (HOSPITAL_BASED_OUTPATIENT_CLINIC_OR_DEPARTMENT_OTHER)
Admission: EM | Admit: 2012-07-30 | Discharge: 2012-07-30 | Disposition: A | Discharge status: Home / Self Care | Payer: Medicaid Other | Attending: Emergency Medicine | Admitting: Emergency Medicine

## 2012-07-30 DIAGNOSIS — M25469 Effusion, unspecified knee: Secondary | ICD-10-CM | POA: Insufficient documentation

## 2012-07-30 DIAGNOSIS — Y939 Activity, unspecified: Secondary | ICD-10-CM | POA: Insufficient documentation

## 2012-07-30 DIAGNOSIS — W010XXA Fall on same level from slipping, tripping and stumbling without subsequent striking against object, initial encounter: Secondary | ICD-10-CM | POA: Insufficient documentation

## 2012-07-30 DIAGNOSIS — IMO0001 Reserved for inherently not codable concepts without codable children: Secondary | ICD-10-CM | POA: Insufficient documentation

## 2012-07-30 DIAGNOSIS — Y929 Unspecified place or not applicable: Secondary | ICD-10-CM | POA: Insufficient documentation

## 2012-07-30 MED ORDER — IBUPROFEN 400 MG PO TABS
600.0000 mg | ORAL_TABLET | Freq: Once | ORAL | Status: AC
  Administered 2012-07-30: 600 mg via ORAL

## 2012-07-30 MED ORDER — OXYCODONE-ACETAMINOPHEN 5-325 MG PO TABS: 2.0000 | ORAL_TABLET | ORAL | Status: DC | PRN

## 2012-07-30 NOTE — ED Notes (Signed)
Patient ambulated to bathroom and back on his own. I helped patient undress and get into a gown. I took vitals.

## 2012-07-30 NOTE — ED Notes (Signed)
Patient transported to X-ray 

## 2012-07-30 NOTE — ED Provider Notes (Signed)
History     CSN: [id number]  Arrival date & time 07/30/12  0600   First MD Initiated Contact with Patient 07/30/12 [phone]      Chief Complaint  Patient presents with  . Fall    (Consider location/radiation/quality/duration/timing/severity/associated sxs/prior treatment) HPI Comments: Pt comes in with cc of fall. States that he tripped and fell on to the right side, and he has pain over the right shoulder and right knee. Pt has ambulated since the fall. No head injury, and denies nausea, vomiting, visual complains, seizures, altered mental status, loss of consciousness, new weakness, or numbness, no gait instability. Pt has no pain elsewhere, he is not on any anticoagulants.   Patient is a 57 y.o. male presenting with fall. The history is provided by the patient.  Fall Pertinent negatives include no fever and no headaches.    No past medical history on file.  No past surgical history on file.  No family history on file.  History  Substance Use Topics  . Smoking status: Not on file  . Smokeless tobacco: Not on file  . Alcohol Use: Not on file      Review of Systems  Constitutional: Negative for fever, chills and activity change.  HENT: Negative for neck pain.   Eyes: Negative for visual disturbance.  Respiratory: Negative for cough, chest tightness and shortness of breath.   Cardiovascular: Positive for leg swelling. Negative for chest pain.  Gastrointestinal: Negative for abdominal distention.  Genitourinary: Negative for dysuria, enuresis and difficulty urinating.  Musculoskeletal: Positive for myalgias and arthralgias.  Neurological: Negative for dizziness, light-headedness and headaches.  Psychiatric/Behavioral: Negative for confusion.    Allergies  Sulfamethoxazole w-trimethoprim  Home Medications  No current outpatient prescriptions on file.  BP 142/97  Pulse 87  Temp(Src) 98.1 F (36.7 C) (Oral)  SpO2 99%  Physical Exam  Nursing note and vitals  reviewed. Constitutional: He is oriented to person, place, and time. He appears well-developed.  HENT:  Head: Normocephalic and atraumatic.  Eyes: Conjunctivae and EOM are normal. Pupils are equal, round, and reactive to light.  Neck: Normal range of motion. Neck supple.  Cardiovascular: Normal rate and regular rhythm.   Pulmonary/Chest: Effort normal and breath sounds normal.  Abdominal: Soft. Bowel sounds are normal. He exhibits no distension. There is no tenderness. There is no rebound and no guarding.  Musculoskeletal:  Right shoulder - pt has tenderness over the right AC joint, able to abduct - upto the level of the shoulder plain. No deformity.  Right knee - pt has effusion, and what appears to be a lump of possible muscle fibers - superolateral to the patella. Knee ROM is intact, and there is no laxity on anterior drawers, posterior drawers.  Neurological: He is alert and oriented to person, place, and time.  Skin: Skin is warm.    ED Course  Procedures (including critical care time)  Labs Reviewed - No data to display No results found.   No diagnosis found.    MDM  DDx includes: - Mechanical falls - ICH - Fractures - Contusions - Soft tissue injury   Pt comes in with cc of fall. Pt appears to have possible rotator cuff tendinitis from the fall. Also, the right knee appears to have possible partial tear of quad muscles. Plan is to get [place] to r/o muscle tear. We have ordered knee immobilizer and crutches. If unable to get [place], or the [place] is positive - patent will be instructed to get ortho f/u.  Dr. Judd Lien assuming care, and will f/u on [place] results and will provide f/u.  Derwood Kaplan, MD 07/30/12 [phone]

## 2012-07-30 NOTE — ED Notes (Signed)
Patient arrived by EMS after slipping on ice outside and pain to right shoulder and right knee

## 2012-07-30 NOTE — ED Provider Notes (Signed)
Care assumed from Dr. Rhunette Croft at shift change.  He had ordered an ultrasound of the right quadriceps to rule out a tear.  The results returned showing a complex joint effusion but no tear of the quadriceps.  The radiologist called me personally and did not feel as though this was the most appropriate test for this.  He seems to think that an mri would be most appropriate.  I will discharge him home with an immoblizer and pain meds.  He is to see an orthopedist for follow up.  Timothy Lyons, MD 07/30/12 1146

## 2012-12-05 ENCOUNTER — Encounter: Payer: Self-pay | Admitting: Gastroenterology

## 2013-09-11 ENCOUNTER — Encounter: Payer: Self-pay | Admitting: Gastroenterology

## 2013-12-01 ENCOUNTER — Emergency Department (HOSPITAL_COMMUNITY)
Admission: EM | Admit: 2013-12-01 | Discharge: 2013-12-01 | Disposition: A | Discharge status: Home / Self Care | Payer: Medicaid Other | Attending: Emergency Medicine | Admitting: Emergency Medicine

## 2013-12-01 ENCOUNTER — Encounter (HOSPITAL_COMMUNITY): Payer: Self-pay | Admitting: Emergency Medicine

## 2013-12-01 DIAGNOSIS — I83891 Varicose veins of right lower extremities with other complications: Secondary | ICD-10-CM

## 2013-12-01 DIAGNOSIS — I83893 Varicose veins of bilateral lower extremities with other complications: Secondary | ICD-10-CM | POA: Insufficient documentation

## 2013-12-01 DIAGNOSIS — L989 Disorder of the skin and subcutaneous tissue, unspecified: Secondary | ICD-10-CM | POA: Diagnosis present

## 2013-12-01 DIAGNOSIS — F17200 Nicotine dependence, unspecified, uncomplicated: Secondary | ICD-10-CM | POA: Diagnosis not present

## 2013-12-01 NOTE — ED Notes (Signed)
Pt started picking at sore and sore started shooting blood approx 1.5 feet in waiting room.

## 2013-12-01 NOTE — Discharge Instructions (Signed)
You leg has stopped bleeding.  Leave dressing on there for the next several hours. DO NOT SCRATCH OR PICK AT SCAB ON LEG, THIS WILL CAUSE IT TO START BLEEDING AGAIN. Return to the ED for new concerns.

## 2013-12-01 NOTE — ED Provider Notes (Signed)
Medical screening examination/treatment/procedure(s) were performed by non-physician practitioner and as supervising physician I was immediately available for consultation/collaboration.   EKG Interpretation None        Daniela Siebers F Eupha Lobb, MD 12/01/13 1938 

## 2013-12-01 NOTE — ED Notes (Signed)
Pt reports sore on right ankle bleeding.

## 2013-12-01 NOTE — ED Notes (Signed)
Dressing applied bleeding controlled

## 2013-12-01 NOTE — ED Provider Notes (Signed)
CSN: [id number]     Arrival date & time 12/01/13  1146 History   First MD Initiated Contact with Patient 12/01/13 1239     Chief Complaint  Patient presents with  . Sore     (Consider location/radiation/quality/duration/timing/severity/associated sxs/prior Treatment) The history is provided by the patient and medical records.   This is a 58 year old male with no significant past medical history, presented bleeding from his right lower leg.  Patient states a few days ago ankle was bleeding briefly but quickly stopped.  States earlier today, he fell he with the patient on his right foot and in his right shoe, but down and noticed that his ankle was bleeding. States it had slowed down, but began bleeding heavily again while in the ED waiting room.  Nursing staff reports that he was actively picking at a scab on his right lower leg, afterwards bleeding resumed.  Pt is not currently on any anti-coagulants.  No dizziness, lightheadedness, or feelings of syncope.  No past medical history on file. No past surgical history on file. No family history on file. History  Substance Use Topics  . Smoking status: Current Every Day Smoker  . Smokeless tobacco: Not on file  . Alcohol Use: Yes    Review of Systems  Skin: Positive for wound.  All other systems reviewed and are negative.     Allergies  Sulfamethoxazole-trimethoprim  Home Medications   Prior to Admission medications   Not on File   BP 142/93  Pulse 80  Temp(Src) 98.5 F (36.9 C) (Oral)  Resp 22  Ht 6\' 2"  (1.88 m)  Wt 215 lb (97.523 kg)  BMI 27.59 kg/m2  SpO2 99%  Physical Exam  Nursing note and vitals reviewed. Constitutional: He is oriented to person, place, and time. He appears well-developed and well-nourished. No distress.  HENT:  Head: Normocephalic and atraumatic.  Mouth/Throat: Oropharynx is clear and moist.  Eyes: Conjunctivae and EOM are normal. Pupils are equal, round, and reactive to light.  Neck: Normal  range of motion. Neck supple.  Cardiovascular: Normal rate, regular rhythm and normal heart sounds.   Pulmonary/Chest: Effort normal and breath sounds normal. No respiratory distress. He has no wheezes.  Abdominal: Soft. Bowel sounds are normal. There is no tenderness. There is no guarding.  Musculoskeletal: Normal range of motion.  Bilateral lower extremities with varicosities; right lower medial calf with small, bleeding varicose vein no calf asymmetry, tenderness, or palpable cords; negative Homan's sign bilaterally; DP pulses intact bilaterally; feet warm and dry; no ulcerations or blisters present; no erythema, induration, or signs of cellulitis  Neurological: He is alert and oriented to person, place, and time.  Skin: Skin is warm and dry. He is not diaphoretic.  Psychiatric: He has a normal mood and affect.    ED Course  Procedures (including critical care time) Labs Review Labs Reviewed - No data to display  Imaging Review No results found.   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 With bleeding varicose vein of RLE.  Leg remains NVI without signs/sx concerning for DVT, cellulitis, or other infectious process.  Pressure dressing applied, will monitor.  After 45 min observation, bleeding has stopped with scab present atop wound.  I have personally changed pressure dressing.  No signs/sx concerning for blood loss anemia, feel pt can be safelt discharged home.  Instructed to leave in place for the next several hours, instructed  not to pick or scratch scab as this will cause recurrent bleeding.  Discussed plan with patient, he/she acknowledged understanding and agreed with plan of care.  Return precautions given for new or worsening symptoms.  Garlon HatchetLisa M Rashawn Rolon, PA-C 12/01/13 1453

## 2014-03-01 ENCOUNTER — Emergency Department (HOSPITAL_COMMUNITY)
Admission: EM | Admit: 2014-03-01 | Discharge: 2014-03-01 | Disposition: A | Discharge status: Home / Self Care | Payer: Medicaid Other | Attending: Emergency Medicine | Admitting: Emergency Medicine

## 2014-03-01 ENCOUNTER — Encounter (HOSPITAL_COMMUNITY): Payer: Self-pay | Admitting: Emergency Medicine

## 2014-03-01 ENCOUNTER — Emergency Department (HOSPITAL_COMMUNITY): Payer: Medicaid Other

## 2014-03-01 DIAGNOSIS — M25471 Effusion, right ankle: Secondary | ICD-10-CM

## 2014-03-01 DIAGNOSIS — M25561 Pain in right knee: Secondary | ICD-10-CM | POA: Insufficient documentation

## 2014-03-01 DIAGNOSIS — G8929 Other chronic pain: Secondary | ICD-10-CM | POA: Diagnosis not present

## 2014-03-01 MED ORDER — OXYCODONE-ACETAMINOPHEN 5-325 MG PO TABS: 1.0000 | ORAL_TABLET | ORAL | Status: DC | PRN

## 2014-03-01 MED ORDER — PREDNISONE 20 MG PO TABS: 40.0000 mg | ORAL_TABLET | Freq: Every day | ORAL | Status: DC

## 2014-03-01 NOTE — ED Notes (Signed)
Pt from home c/o R knee pain x1 year and swelling to R ankle x2 days. Pt denies fall or injury, CP, SOB. Pt sts that he does not take any meds at home. Pt is A&O and in NAD.

## 2014-03-01 NOTE — ED Provider Notes (Signed)
CSN: [id number]     Arrival date & time 03/01/14  1406 History   First MD Initiated Contact with Patient 03/01/14 1619     Chief Complaint  Patient presents with  . Knee Pain  . Joint Swelling     (Consider location/radiation/quality/duration/timing/severity/associated sxs/prior Treatment) Patient is a 58 y.o. male presenting with knee pain. The history is provided by the patient and medical records.  Knee Pain  This is a 58 year old male with no significant past medical history presenting to the ED for right knee and ankle pain. States over the past year he has been having intermittent issues with his right knee including recurrent swelling and "giving out".  States he has been wearing a knee brace with some improvement of his symptoms. He began noticing right ankle swelling 2 days ago. He denies any specific injury or fall.  He denies any numbness or paresthesias of his right lower extremity.  Denies chest pain, SOB, palpitations, dizziness, weakness.  No medications taken PTA.  Denies hx of gout.  Drinks EtOH regularly, denies any prior to arrival.  VS stable.  History reviewed. No pertinent past medical history. History reviewed. No pertinent past surgical history. No family history on file. History  Substance Use Topics  . Smoking status: Never Smoker   . Smokeless tobacco: Not on file  . Alcohol Use: Yes    Review of Systems  Musculoskeletal: Positive for arthralgias and joint swelling.  All other systems reviewed and are negative.     Allergies  Sulfamethoxazole-trimethoprim  Home Medications   Prior to Admission medications   Medication Sig Start Date End Date Taking? Authorizing Provider  Aspirin-Salicylamide-Caffeine (BC HEADACHE POWDER PO) Take 1 tablet by mouth 2 (two) times daily as needed (for knee pain).     Historical Provider, MD   BP 132/82  Pulse 88  Temp(Src) 98.1 F (36.7 C) (Oral)  Resp 16  SpO2 100%  Physical Exam  Nursing note and vitals  reviewed. Constitutional: He is oriented to person, place, and time. He appears well-developed and well-nourished.  HENT:  Head: Normocephalic and atraumatic.  Mouth/Throat: Oropharynx is clear and moist.  Eyes: Conjunctivae and EOM are normal. Pupils are equal, round, and reactive to light.  Neck: Normal range of motion.  Cardiovascular: Normal rate, regular rhythm and normal heart sounds.   Pulmonary/Chest: Effort normal and breath sounds normal. No respiratory distress. He has no wheezes.  Abdominal: Soft. Bowel sounds are normal. There is no tenderness. There is no guarding.  Musculoskeletal: Normal range of motion.  Right knee with diffuse swelling noted; no gross bony deformities; slightly limited flexion, full extension (states baseline ROM) Right ankle with swelling along medial aspect; full ROM maintained without bony deformities DP pulse and sensation intact diffusely throughout leg and foot; moving all toes appropriately No calf asymmetry, tenderness, or palpable cords; no overlying erythema or warmth to touch  Neurological: He is alert and oriented to person, place, and time.  Skin: Skin is warm and dry.  Psychiatric: He has a normal mood and affect.    ED Course  Procedures (including critical care time) Labs Review Labs Reviewed - No data to display  Imaging Review Dg Ankle Complete Right  03/01/2014   CLINICAL DATA:  Chronic progressive right ankle pain for several months without known injury  EXAM: RIGHT ANKLE - COMPLETE 3+ VIEW  COMPARISON:  None.  FINDINGS: The ankle joint mortise is preserved. The talar dome is intact. There is soft tissue swelling diffusely, especially  medially. There is faint soft tissue calcification lateral to the medial malleolus that does not appear to reflect an avulsion fracture. A benign appearing "tug lesion" is noted involving the distal portion of the fibula. The talus and calcaneus are intact. There is a moderate-sized plantar calcaneal  spur.  IMPRESSION: No acute or significant chronic bony abnormality of the right ankle is demonstrated. There is considerable soft tissue swelling. Given the patient's chronic progressive symptoms, MRI of the right ankle may be useful.   Electronically Signed   By: David  SwazilandJordan   On: 03/01/2014 17:28   Dg Knee Complete 4 Views Right  03/01/2014   CLINICAL DATA:  Several month history of progressive pain  EXAM: RIGHT KNEE - COMPLETE 4+ VIEW  COMPARISON:  July 30, 2012  FINDINGS: Frontal, lateral, and bilateral oblique views were obtained. There is no acute fracture or dislocation. There is a moderate joint effusion.  There is marked narrowing laterally progressed from prior study. There is marked spur formation laterally. There is milder spur formation arising from the posterior tibia, stable from previous study. Marked calcification of the medial collateral ligament is stable and is consistent with prior trauma in this region.  IMPRESSION: Extensive osteoarthritic change, most marked laterally. There has been interval progression of osteoarthritic change laterally in this area. There is moderate osteoarthritic change in the patellofemoral joint, stable. There is evidence of old trauma the medial collateral ligament with extensive calcification this area, stable. There is a joint effusion. No acute fracture or dislocation.   Electronically Signed   By: Bretta BangWilliam  Woodruff M.D.   On: 03/01/2014 17:26     EKG Interpretation None      MDM   Final diagnoses:  Chronic knee pain, right  Right ankle swelling   58 y.o. M with chronic right knee pain and new right ankle swelling.  On exam, patient afebrile and in NAD.  Right knee with generalized swelling, right ankle with swelling localized to medial aspect of ankle.  Leg remains NVI, ambulating with steady gait.  No pitting edema or signs of fluid overload.  No clinical signs of septic joint.  Imaging was obtained, negative for acute findings-- suggestion of  potential MRI of right ankle for progressive symptoms however, do not feel that patient needs this emergently as i have no clinical concern for osteomyelitis nor septic joint.  Possibility of gout as patient does drink EtOH regularly.  Will treat with pain meds and prednisone.  Patient encouraged to FU with [facility] for re-check.  Discussed plan with patient, he/she acknowledged understanding and agreed with plan of care.  Return precautions given for new or worsening symptoms.  Case discussed with attending physician, Dr. Juleen ChinaKohut, who agrees with assessment and plan of care.  Garlon HatchetLisa M Ocie Stanzione, PA-C 03/01/14 2136

## 2014-03-01 NOTE — ED Notes (Signed)
Pt sleeping soundly, rr even/un-lab. NAD.

## 2014-03-01 NOTE — Discharge Instructions (Signed)
Take the prescribed medication as directed. °Follow-up with the [facility]. °Return to the ED for new or worsening symptoms. ° °

## 2014-03-01 NOTE — ED Notes (Signed)
Initial Contact - pt awake, alert, presents with c/o R knee pain x1 year and new R ankle pain.  10/10. Pt denies injury.  MAEI, +csm/+pulses, self repositioning for comfort.  Skin PWD.  NAD.

## 2014-03-01 NOTE — ED Notes (Signed)
Pt to radiology.

## 2014-03-02 NOTE — ED Provider Notes (Signed)
Medical screening examination/treatment/procedure(s) were performed by non-physician practitioner and as supervising physician I was immediately available for consultation/collaboration.   EKG Interpretation None       Kymora Sciara, MD 03/02/14 1652 

## 2014-07-25 ENCOUNTER — Emergency Department (HOSPITAL_COMMUNITY): Payer: Medicaid Other

## 2014-07-25 ENCOUNTER — Emergency Department (INDEPENDENT_AMBULATORY_CARE_PROVIDER_SITE_OTHER): Payer: Medicaid Other

## 2014-07-25 ENCOUNTER — Encounter (HOSPITAL_COMMUNITY): Payer: Self-pay | Admitting: Emergency Medicine

## 2014-07-25 ENCOUNTER — Emergency Department (HOSPITAL_COMMUNITY)
Admission: EM | Admit: 2014-07-25 | Discharge: 2014-07-25 | Disposition: A | Discharge status: Home / Self Care | Payer: Medicaid Other | Source: Home / Self Care | Attending: Family Medicine | Admitting: Family Medicine

## 2014-07-25 DIAGNOSIS — M1990 Unspecified osteoarthritis, unspecified site: Secondary | ICD-10-CM | POA: Diagnosis not present

## 2014-07-25 DIAGNOSIS — M7641 Tibial collateral bursitis [Pellegrini-Stieda], right leg: Secondary | ICD-10-CM | POA: Diagnosis not present

## 2014-07-25 DIAGNOSIS — M25461 Effusion, right knee: Secondary | ICD-10-CM

## 2014-07-25 LAB — GRAM STAIN

## 2014-07-25 LAB — SYNOVIAL CELL COUNT + DIFF, W/ CRYSTALS
Crystals, Fluid: NONE SEEN
EOSINOPHILS-SYNOVIAL: 0 % (ref 0–1)
LYMPHOCYTES-SYNOVIAL FLD: 25 % — AB (ref 0–20)
Monocyte-Macrophage-Synovial Fluid: 14 % — ABNORMAL LOW (ref 50–90)
Neutrophil, Synovial: 61 % — ABNORMAL HIGH (ref 0–25)
WBC, Synovial: 246 /mm3 — ABNORMAL HIGH (ref 0–200)

## 2014-07-25 LAB — POCT I-STAT, CHEM 8
BUN: 11 mg/dL (ref 6–23)
CALCIUM ION: 1.15 mmol/L (ref 1.13–1.30)
Chloride: 105 mmol/L (ref 96–112)
Creatinine, Ser: 0.9 mg/dL (ref 0.50–1.35)
Glucose, Bld: 77 mg/dL (ref 70–99)
HEMATOCRIT: 41 % (ref 39.0–52.0)
Hemoglobin: 13.9 g/dL (ref 13.0–17.0)
Potassium: 3.8 mmol/L (ref 3.5–5.1)
SODIUM: 143 mmol/L (ref 135–145)
TCO2: 21 mmol/L (ref 0–100)

## 2014-07-25 LAB — URIC ACID: Uric Acid, Serum: 9.2 mg/dL — ABNORMAL HIGH (ref 4.0–7.8)

## 2014-07-25 MED ORDER — DICLOFENAC SODIUM 75 MG PO TBEC: 75.0000 mg | DELAYED_RELEASE_TABLET | Freq: Two times a day (BID) | ORAL | Status: DC

## 2014-07-25 MED ORDER — LIDOCAINE HCL 2 % IJ SOLN
INTRAMUSCULAR | Status: AC
  Filled 2014-07-25: qty 20

## 2014-07-25 MED ORDER — METHYLPREDNISOLONE ACETATE 40 MG/ML IJ SUSP
INTRAMUSCULAR | Status: AC
  Filled 2014-07-25: qty 1

## 2014-07-25 NOTE — Discharge Instructions (Signed)
You have severe arthritis of your knees. You need to see an orthopedic surgeon Please follow up in one of their offices as soon as possible The steroid injection you were given should help with the pain Please use the voltaren for additional pain

## 2014-07-25 NOTE — ED Notes (Signed)
C/o bilateral leg pain, right knee worse than left knee, no recent injury

## 2014-07-25 NOTE — ED Provider Notes (Signed)
CSN: [id number]     Arrival date & time 07/25/14  1125 History   First MD Initiated Contact with Patient 07/25/14 1229     Chief Complaint  Patient presents with  . Leg Pain   (Consider location/radiation/quality/duration/timing/severity/associated sxs/prior Treatment) HPI   BIlat knee pain. Present for years. Comes and goes. Becoming more frequent. Weather change, and increased movement makes it worse. BC w/ some benefit. Wears OTC knee braces w/ some benefit. Denies fevers, CP, SOb, abd pain, falls, trauma to knees. H/o playing football in HS. R knee w/ fluid.   History reviewed. No pertinent past medical history. History reviewed. No pertinent past surgical history. Family History  Problem Relation Age of Onset  . Diabetes Mother   . Emphysema Father    History  Substance Use Topics  . Smoking status: Never Smoker   . Smokeless tobacco: Not on file  . Alcohol Use: Yes    Review of Systems Per HPI with all other pertinent systems negative.   Allergies  Sulfamethoxazole-trimethoprim  Home Medications   Prior to Admission medications   Medication Sig Start Date End Date Taking? Authorizing Provider  Aspirin-Salicylamide-Caffeine (BC HEADACHE POWDER PO) Take 1 tablet by mouth 2 (two) times daily as needed (for knee pain).     Historical Provider, MD  diclofenac (VOLTAREN) 75 MG EC tablet Take 1 tablet (75 mg total) by mouth 2 (two) times daily. 07/25/14   Ozella Rocks, MD   BP 137/79 mmHg  Pulse 84  Temp(Src) 97.9 F (36.6 C) (Oral)  Resp 18  SpO2 100% Physical Exam  Constitutional: He is oriented to person, place, and time. He appears well-developed.  HENT:  Head: Normocephalic and atraumatic.  Eyes: EOM are normal. Pupils are equal, round, and reactive to light.  Neck: Normal range of motion.  Cardiovascular: Normal rate, normal heart sounds and intact distal pulses.   Pulmonary/Chest: Effort normal and breath sounds normal.  Abdominal: Soft.  Musculoskeletal:   Significant right knee effusion. Both knees with decreased range of motion. Both these tender to palpation along the joint line right knee without induration, erythema, overlying cellulitis. Crepitus bilaterally.Lachmans, varus and valgus stresses all normal bilaterally.  Neurological: He is oriented to person, place, and time.  Skin: Skin is warm.  Psychiatric: He has a normal mood and affect. His behavior is normal. Judgment and thought content normal.    ED Course  Injection of joint Date/Time: 07/25/2014 2:15 PM Performed by: Konrad Dolores, DAVID J Authorized by: Konrad Dolores, DAVID J Consent: Verbal consent obtained. Consent given by: patient Preparation: Patient was prepped and draped in the usual sterile fashion. Local anesthesia used: yes Local anesthetic: lidocaine 1% without epinephrine Anesthetic total: 2 ml Comments: Using a superior lateral approach. Injection was marked and then cleaned with alcohol wipes. Area was anesthetized along the superficial skin and tract for the needle using 2 mL of lidocaine. The needle with was then withdrawn and a larger 18-gauge needle on a 60 mL syringe was inserted into the joint space applying gentle pressure 53 mL of serous sanguinous fluid was extracted. Using sterile forceps the syringe was then removed and a fall or smaller 5 mL syringe containing 2 mL of lidocaine 1 mL of 40 mg Depo-Medrol was then attached and the medication was injected into the joint.   (including critical care time) Labs Review Labs Reviewed  BODY FLUID CULTURE  GRAM STAIN  URIC ACID  BODY FLUID CRYSTAL  SYNOVIAL CELL COUNT + DIFF, W/ CRYSTALS  POCT  I-STAT, CHEM 8    Imaging Review Dg Knee Complete 4 Views Left  07/25/2014   CLINICAL DATA:  59 year old male complaining of pain and swelling in bilateral knees.  EXAM: LEFT KNEE - COMPLETE 4+ VIEW  COMPARISON:  No priors.  FINDINGS: Several soft tissue calcifications are noted posterior to the joint space, particularly  medially. Mild ossification of the medial collateral ligament. Multifocal joint space narrowing, subchondral sclerosis, subchondral cyst formation and osteophyte formation, most severe in the lateral and patellofemoral compartments, compatible with osteoarthritis.  IMPRESSION: 1. No acute radiographic abnormality of the left knee. 2. Moderate tricompartmental osteoarthritis, most severe in the lateral and patellofemoral compartments.   Electronically Signed   By: Trudie Reedaniel  Entrikin M.D.   On: 07/25/2014 13:25   Dg Knee Complete 4 Views Right  07/25/2014   CLINICAL DATA:  59 year old male with swelling in both knees. Bilateral knee pain.  EXAM: RIGHT KNEE - COMPLETE 4+ VIEW  COMPARISON:  03/01/2014.  FINDINGS: Four views of the right knee demonstrate no acute displaced fracture, subluxation or dislocation. Again noted is extensive ossification of the medial collateral ligament off the medial femoral condyle, compatible with advanced Pellegrini-Stieda disease. Joint space narrowing, subchondral sclerosis, subchondral cyst formation and osteophyte formation is again noted in a tricompartmental distribution, most severe in the lateral and patellofemoral compartments.  IMPRESSION: 1. No acute radiographic abnormality of the right knee. 2. 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 3. Advanced Pellegrini-Stieda disease redemonstrated.   Electronically Signed   By: Trudie Reedaniel  Entrikin M.D.   On: 07/25/2014 13:21     MDM   1. Arthritis   2. Pellegrini-Steida syndrome, right   3. Knee effusion, right    Severe bilateral arthritis. X-rays of 10 bilaterally joint arthritis and Pellegrini Stieda syndrome joint aspiration and injection of the right knee. Patient follow-up with orthopedic surgery. Voltaren prescribed to be used when necessary for additional pain.  Chem 8 showing normal renal function. Uric acid pending Joint fluid sent for Gram stain, culture, cell  count and crystal analysis.  Precautions given and all questions answered    Shelly Flattenavid Merrell, MD Family Medicine 07/25/2014, 2:18 PM      Ozella Rocksavid J Merrell, MD 07/25/14 [phone]

## 2014-07-29 LAB — BODY FLUID CULTURE: Culture: NO GROWTH

## 2014-07-30 ENCOUNTER — Telehealth (HOSPITAL_COMMUNITY): Payer: Self-pay | Admitting: *Deleted

## 2014-07-30 NOTE — ED Notes (Signed)
[facility] pharmacist called requesting prior authorization: informed this practice does not do prior authorizations.  Dr Konrad Doloresmerrell suggested changing medication to meloxicam-pharmacy reports insurance will not pay for this.  Dr Konrad Doloresmerrell ordered motrin 600mg  1 every 6-8 hours as needed, #30, no refill.  Pharmacy accepted order.

## 2014-08-28 NOTE — ED Notes (Signed)
Patient presented w Rx refill questions. After review of record, was advised there are no refills on any Rx from this office authorized. Last note 3-8. Further advised we will be delighted to see for his continued pain issues, but should ideally see his PCP for this issue

## 2014-08-29 ENCOUNTER — Emergency Department (HOSPITAL_COMMUNITY)
Admission: EM | Admit: 2014-08-29 | Discharge: 2014-08-29 | Disposition: A | Discharge status: Home / Self Care | Payer: Medicaid Other | Attending: Emergency Medicine | Admitting: Emergency Medicine

## 2014-08-29 ENCOUNTER — Encounter (HOSPITAL_COMMUNITY): Payer: Self-pay | Admitting: Emergency Medicine

## 2014-08-29 DIAGNOSIS — M79604 Pain in right leg: Secondary | ICD-10-CM | POA: Diagnosis present

## 2014-08-29 DIAGNOSIS — M25461 Effusion, right knee: Secondary | ICD-10-CM | POA: Insufficient documentation

## 2014-08-29 DIAGNOSIS — Z791 Long term (current) use of non-steroidal anti-inflammatories (NSAID): Secondary | ICD-10-CM | POA: Insufficient documentation

## 2014-08-29 MED ORDER — LIDOCAINE-EPINEPHRINE 2 %-1:100000 IJ SOLN
20.0000 mL | Freq: Once | INTRAMUSCULAR | Status: AC
  Administered 2014-08-29: 20 mL via INTRADERMAL
  Filled 2014-08-29: qty 1

## 2014-08-29 NOTE — ED Notes (Signed)
PA Ben at bedside. 

## 2014-08-29 NOTE — ED Provider Notes (Signed)
CSN: [id number]     Arrival date & time 08/29/14  1544 History   First MD Initiated Contact with Patient 08/29/14 1728     Chief Complaint  Patient presents with  . Leg Pain     (Consider location/radiation/quality/duration/timing/severity/associated sxs/prior Treatment) HPI Timothy Odonnell is a 59 y.o. male with a history of right knee effusion comes in for evaluation of increased knee swelling. Patient states approximately 3-4 weeks ago he went to his primary care doctor and had some fluid drained off his knee. Patient is a very poor historian but denies any trauma, fevers, chills, numbness or weakness, loss of range of motion, shortness of breath, cough, chest pain, recent travels or surgeries, hemoptysis. He reports having taken Motrin for his discomfort as prescribed by his PCP. He reports some discomfort now and very commonly rates his discomfort as a 10/10.  History reviewed. No pertinent past medical history. No past surgical history on file. Family History  Problem Relation Age of Onset  . Diabetes Mother   . Emphysema Father    History  Substance Use Topics  . Smoking status: Never Smoker   . Smokeless tobacco: Not on file  . Alcohol Use: Yes    Review of Systems A 10 point review of systems was completed and was negative except for pertinent positives and negatives as mentioned in the history of present illness    Allergies  Sulfamethoxazole-trimethoprim  Home Medications   Prior to Admission medications   Medication Sig Start Date End Date Taking? Authorizing Provider  Aspirin-Salicylamide-Caffeine (BC HEADACHE POWDER PO) Take 1 tablet by mouth 2 (two) times daily as needed (for knee pain).    Yes Historical Provider, MD  meloxicam (MOBIC) 15 MG tablet Take 15 mg by mouth daily.   Yes Historical Provider, MD  sodium chloride (OCEAN) 0.65 % SOLN nasal spray Place 1-2 sprays into both nostrils daily as needed for congestion.   Yes Historical Provider, MD  diclofenac  (VOLTAREN) 75 MG EC tablet Take 1 tablet (75 mg total) by mouth 2 (two) times daily. 07/25/14   Ozella Rocksavid J Merrell, MD   BP 125/80 mmHg  Pulse 85  Temp(Src) 98.8 F (37.1 C) (Oral)  Resp 20  SpO2 100% Physical Exam  Constitutional: He is oriented to person, place, and time. He appears well-developed and well-nourished.  HENT:  Head: Normocephalic and atraumatic.  Mouth/Throat: Oropharynx is clear and moist.  Eyes: Conjunctivae are normal. Pupils are equal, round, and reactive to light. Right eye exhibits no discharge. Left eye exhibits no discharge. No scleral icterus.  Neck: Neck supple.  Cardiovascular: Normal rate, regular rhythm and normal heart sounds.   Pulmonary/Chest: Effort normal and breath sounds normal. No respiratory distress. He has no wheezes. He has no rales.  Abdominal: Soft. There is no tenderness.  Musculoskeletal: He exhibits no tenderness.  Diffuse effusion to right knee. Maintains full active range of motion. No overt erythema or warmth noted. Distal pulses intact with brisk cap refill. Feet are edematous, 1+, bilaterally.  Neurological: He is alert and oriented to person, place, and time.  Cranial Nerves II-XII grossly intact  Skin: Skin is warm and dry. No rash noted.  Psychiatric: He has a normal mood and affect.  Nursing note and vitals reviewed.   ED Course  Procedures (including critical care time) Labs Review Labs Reviewed - No data to display  Imaging Review No results found.   EKG Interpretation None     Meds given in ED:  Medications  lidocaine-EPINEPHrine (XYLOCAINE W/EPI) 2 %-1:100000 (with pres) injection 20 mL (20 mLs Intradermal Given by Other 08/29/14 1933)    Discharge Medication List as of 08/29/2014  8:30 PM     Filed Vitals:   08/29/14 1619 08/29/14 1835 08/29/14 2035  BP: 164/86 130/77 125/80  Pulse: 88 84 85  Temp: 98.9 F (37.2 C)  98.8 F (37.1 C)  TempSrc: Oral  Oral  Resp: SpO2: 100% 100% 100%    MDM  Vitals  stable - WNL -afebrile Pt resting comfortably in ED. PE--diffuse knee effusion on exam without evidence of hemarthrosis or septic joint. Maintains full range of motion, neurovascularly intact Effusion drained at bedside by myself and Dr. Silverio Lay. Reports discomfort greatly improved. No evidence of hemarthrosis or septic joint. Pt tolerated procedure well and will be able to follow up with PCP/Ortho for further evaluation and management of symptoms. Ortho referral provided. Ace wrap applied in ED. I discussed all relevant lab findings and imaging results with pt and they verbalized understanding. Discussed f/u with PCP within 48 hrs and return precautions, pt very amenable to plan.  Final diagnoses:  Knee effusion, right      Joycie Peek, PA-C 08/30/14 1525  Richardean Canal, MD 09/01/14 [phone]

## 2014-08-29 NOTE — ED Notes (Signed)
PA at bedside with ultrasound for procedure

## 2014-08-29 NOTE — ED Notes (Signed)
Patient is alert and oriented x3.  He was given DC instructions and follow up visit instructions.  Patient gave verbal understanding.  He was DC ambulatory under his own power to home.  V/S stable.  He was not showing any signs of distress on DC 

## 2014-08-29 NOTE — ED Notes (Signed)
Pt c/o rt knee pain.  Pt states that he had fluid pulled off his knee 3-4 wks ago and it has since swollen back up.  Pt also has bilateral pedal edema.  Rt knee swelling is appx 50% larger than lt knee.

## 2014-08-29 NOTE — Discharge Instructions (Signed)

## 2014-10-23 ENCOUNTER — Encounter (HOSPITAL_COMMUNITY): Payer: Self-pay | Admitting: Emergency Medicine

## 2014-10-23 ENCOUNTER — Emergency Department (HOSPITAL_COMMUNITY)
Admission: EM | Admit: 2014-10-23 | Discharge: 2014-10-23 | Disposition: A | Discharge status: Home / Self Care | Payer: Medicaid Other | Attending: Emergency Medicine | Admitting: Emergency Medicine

## 2014-10-23 DIAGNOSIS — M25461 Effusion, right knee: Secondary | ICD-10-CM | POA: Diagnosis not present

## 2014-10-23 DIAGNOSIS — Z79899 Other long term (current) drug therapy: Secondary | ICD-10-CM | POA: Diagnosis not present

## 2014-10-23 DIAGNOSIS — M25561 Pain in right knee: Secondary | ICD-10-CM | POA: Diagnosis present

## 2014-10-23 DIAGNOSIS — Z7982 Long term (current) use of aspirin: Secondary | ICD-10-CM | POA: Diagnosis not present

## 2014-10-23 MED ORDER — ONDANSETRON 4 MG PO TBDP
4.0000 mg | ORAL_TABLET | Freq: Once | ORAL | Status: AC
  Administered 2014-10-23: 4 mg via ORAL
  Filled 2014-10-23: qty 1

## 2014-10-23 MED ORDER — LIDOCAINE HCL 2 % IJ SOLN
10.0000 mL | Freq: Once | INTRAMUSCULAR | Status: AC
  Administered 2014-10-23: 200 mg
  Filled 2014-10-23: qty 20

## 2014-10-23 MED ORDER — HYDROCODONE-ACETAMINOPHEN 5-325 MG PO TABS: 1.0000 | ORAL_TABLET | ORAL | Status: DC | PRN

## 2014-10-23 MED ORDER — HYDROCODONE-ACETAMINOPHEN 5-325 MG PO TABS
1.0000 | ORAL_TABLET | Freq: Once | ORAL | Status: AC
  Administered 2014-10-23: 1 via ORAL
  Filled 2014-10-23: qty 1

## 2014-10-23 MED ORDER — DICLOFENAC SODIUM 75 MG PO TBEC: 75.0000 mg | DELAYED_RELEASE_TABLET | Freq: Two times a day (BID) | ORAL | Status: DC

## 2014-10-23 NOTE — ED Notes (Signed)
Pt reports acute chronic right knee pain and swelling; waiting referral call for possible knee replacement.

## 2014-10-23 NOTE — Discharge Instructions (Signed)
Knee Effusion °The medical term for having fluid in your knee is effusion. This is often due to an internal derangement of the knee. This means something is wrong inside the knee. Some of the causes of fluid in the knee may be torn cartilage, a torn ligament, or bleeding into the joint from an injury. Your knee is likely more difficult to bend and move. This is often because there is increased pain and pressure in the joint. The time it takes for recovery from a knee effusion depends on different factors, including:  °· Type of injury. °· Your age. °· Physical and medical conditions. °· Rehabilitation Strategies. °How long you will be away from your normal activities will depend on what kind of knee problem you have and how much damage is present. Your knee has two types of cartilage. Articular cartilage covers the bone ends and lets your knee bend and move smoothly. Two menisci, thick pads of cartilage that form a rim inside the joint, help absorb shock and stabilize your knee. Ligaments bind the bones together and support your knee joint. Muscles move the joint, help support your knee, and take stress off the joint itself. °CAUSES  °Often an effusion in the knee is caused by an injury to one of the menisci. This is often a tear in the cartilage. Recovery after a meniscus injury depends on how much meniscus is damaged and whether you have damaged other knee tissue. Small tears may heal on their own with conservative treatment. Conservative means rest, limited weight bearing activity and muscle strengthening exercises. Your recovery may take up to 6 weeks.  °TREATMENT  °Larger tears may require surgery. Meniscus injuries may be treated during arthroscopy. Arthroscopy is a procedure in which your surgeon uses a small telescope like instrument to look in your knee. Your caregiver can make a more accurate diagnosis (learning what is wrong) by performing an arthroscopic procedure. °If your injury is on the inner margin  of the meniscus, your surgeon may trim the meniscus back to a smooth rim. In other cases your surgeon will try to repair a damaged meniscus with stitches (sutures). This may make rehabilitation take longer, but may provide better long term result by helping your knee keep its shock absorption capabilities. °Ligaments which are completely torn usually require surgery for repair. °HOME CARE INSTRUCTIONS °· Use crutches as instructed. °· If a brace is applied, use as directed. °· Once you are home, an ice pack applied to your swollen knee may help with discomfort and help decrease swelling. °· Keep your knee raised (elevated) when you are not up and around or on crutches. °· Only take over-the-counter or prescription medicines for pain, discomfort, or fever as directed by your caregiver. °· Your caregivers will help with instructions for rehabilitation of your knee. This often includes strengthening exercises. °· You may resume a normal diet and activities as directed. °SEEK MEDICAL CARE IF:  °· There is increased swelling in your knee. °· You notice redness, swelling, or increasing pain in your knee. °· An unexplained oral temperature above 102° F (38.9° C) develops. °SEEK IMMEDIATE MEDICAL CARE IF:  °· You develop a rash. °· You have difficulty breathing. °· You have any allergic reactions from medications you may have been given. °· There is severe pain with any motion of the knee. °MAKE SURE YOU:  °· Understand these instructions. °· Will watch your condition. °· Will get help right away if you are not doing well or get worse. °  Document Released: 07/31/2003 Document Revised: 08/02/2011 Document Reviewed: 10/04/2007 °ExitCare® Patient Information ©2015 [employer]. This information is not intended to replace advice given to you by your health care provider. Make sure you discuss any questions you have with your health care provider. ° °Knee Arthrocentesis °Arthrocentesis is a procedure for removing fluid from a  joint. The procedure is used to remove uncomfortable amounts of fluid from a joint or to get a sample of joint fluid for testing. Testing joint fluid can help your health care provider figure out the cause of the pain or swelling you are having in your joint. Infection or gout, among other conditions, can cause fluid to form in joints, resulting in pain or swelling.  °LET YOUR CAREGIVER KNOW ABOUT:  °· Allergies. °· Medications taken including herbs, eye drops, over the counter medications, and creams. °· Use of steroids (by mouth or creams). °· Previous problems with anesthetics or Novocaine. °· History of blood clots (thrombophlebitis). °· History of bleeding or blood problems. °· Previous surgery. °· Other health problems. °RISKS AND COMPLICATIONS °· A local anesthetic may not numb the area well enough and you may feel some minor discomfort. In rare cases, you may have an allergic reaction to the drug used to numb the skin. °· More fluid may form in the joint. °· You may develop infection or bleeding. °BEFORE THE PROCEDURE  °Wash all of the skin around the entire knee area. Try to remove any loose, scaling skin. There is no other specific preparation necessary unless advised otherwise by your caregiver. °AFTER THE PROCEDURE  °· You can go home after the procedure. °· You may need to put ice on the joint 15-20 minutes, 03-04 times a day until pain goes away. °· You may need to put an elastic bandage on the joint. °HOME CARE INSTRUCTIONS  °· Only take over-the-counter or prescription medicines for pain, discomfort, or fever as directed by your caregiver. °· You should avoid stressing the joint. Unless advised otherwise, this includes jogging, bicycling, recreational climbing, hiking, and other activities that would put a lot of pressure on a knee joint. °· Laying down and elevating the leg/knee above the level of your heart can help to minimize return of swelling. °SEEK MEDICAL CARE IF:  °· You have repeated or  worsening swelling. °· There is drainage from the puncture area. °· You develop red streaking that extends above or below the site where the needle had been placed. °SEEK IMMEDIATE MEDICAL CARE IF:  °· You develop a fever. °· You have pain that gets worse even though you are taking pain medicine. °· The area is red and warm and you have trouble moving the joint. °MAKE SURE YOU:  °· Understand these instructions. °· Will watch your condition. °· Will get help right away if you are not doing well or get worse. °Document Released: 07/29/2006 Document Revised: 08/02/2011 Document Reviewed: 04/24/2007 °ExitCare® Patient Information ©2015 [employer]. This information is not intended to replace advice given to you by your health care provider. Make sure you discuss any questions you have with your health care provider. ° °

## 2014-10-23 NOTE — ED Provider Notes (Signed)
CSN: [id number]     Arrival date & time 10/23/14  1139 History  This chart was scribed for non-physician practitioner Marlon Pel, PA-C working with Cathren Laine, MD by Murriel Hopper, ED Scribe. This patient was seen in room WTR6/WTR6 and the patient's care was started at 12:25 PM.    Chief Complaint  Patient presents with  . Knee Pain      The history is provided by the patient. No language interpreter was used.     HPI Comments: Timothy Odonnell is a 59 y.o. male who presents to the Emergency Department complaining of constant, worsening, chronic right knee pain with associated swelling that has been present for a few months. Pt reports having it drained three times before, and notes that his most recent episode of swelling has been present for four days. Pt reports trouble walking, and denies taking any medication for pain. Pt denies known injury to knee, denies fever.   History reviewed. No pertinent past medical history. History reviewed. No pertinent past surgical history. Family History  Problem Relation Age of Onset  . Diabetes Mother   . Emphysema Father    History  Substance Use Topics  . Smoking status: Never Smoker   . Smokeless tobacco: Not on file  . Alcohol Use: Yes    Review of Systems  Constitutional: Negative for fever.  Musculoskeletal: Positive for joint swelling and arthralgias.      Allergies  Sulfamethoxazole-trimethoprim  Home Medications   Prior to Admission medications   Medication Sig Start Date End Date Taking? Authorizing Provider  Aspirin-Salicylamide-Caffeine (BC HEADACHE POWDER PO) Take 1 tablet by mouth 2 (two) times daily as needed (for knee pain).    Yes Historical Provider, MD  sodium chloride (OCEAN) 0.65 % SOLN nasal spray Place 1-2 sprays into both nostrils daily as needed for congestion.   Yes Historical Provider, MD  diclofenac (VOLTAREN) 75 MG EC tablet Take 1 tablet (75 mg total) by mouth 2 (two) times daily. 10/23/14   Marlon Pel, PA-C  HYDROcodone-acetaminophen (NORCO/VICODIN) 5-325 MG per tablet Take 1 tablet by mouth every 4 (four) hours as needed. 10/23/14   Ree Alcalde Neva Seat, PA-C   BP 142/98 mmHg  Pulse 83  Temp(Src) 97.7 F (36.5 C) (Oral)  Resp 20  SpO2 99% Physical Exam  Constitutional: He is oriented to person, place, and time. He appears well-developed and well-nourished.  HENT:  Head: Normocephalic and atraumatic.  Cardiovascular: Normal rate.   Pulmonary/Chest: Effort normal.  Abdominal: He exhibits no distension.  Musculoskeletal:  Large effusion to right knee without induration or erythema  No deformities  Decreased ROM due to pain and swelling    Neurological: He is alert and oriented to person, place, and time.  Skin: Skin is warm and dry.  Psychiatric: He has a normal mood and affect.  Nursing note and vitals reviewed.   ED Course  ARTHOCENTESIS Date/Time: 10/23/2014 2:17 PM Performed by: Marlon Pel Authorized by: Marlon Pel Consent: Verbal consent obtained. Risks and benefits: risks, benefits and alternatives were discussed Consent given by: patient Patient understanding: patient states understanding of the procedure being performed Patient identity confirmed: verbally with patient Indications: joint swelling and pain  Body area: knee Local anesthesia used: yes Local anesthetic: lidocaine 1% without epinephrine Patient sedated: no Preparation: Patient was prepped and draped in the usual sterile fashion. Needle gauge: 18 G Ultrasound guidance: no Approach: superior Aspirate: yellow Aspirate amount: 130 mL Patient tolerance: Patient tolerated the procedure well with no immediate  complications Comments: Significant pain relief   (including critical care time)  DIAGNOSTIC STUDIES: Oxygen Saturation is 99% on room air, normal by my interpretation.    COORDINATION OF CARE: 12:31 PM Discussed treatment plan with pt at bedside and pt agreed to plan.   Labs  Review Labs Reviewed - No data to display  Imaging Review No results found.   EKG Interpretation None      MDM   Final diagnoses:  Knee effusion, right    Fluid was straw colored with no associated odor, no signs of septic joint. No induration or erythema. He expressed significant pain relief with joint aspiration.  Compression dressing placed. Discussed follow-up care and wound care from procedure. Referral to Ortho given.  Medications  lidocaine (XYLOCAINE) 2 % (with pres) injection 200 mg (not administered)  HYDROcodone-acetaminophen (NORCO/VICODIN) 5-325 MG per tablet 1 tablet (1 tablet Oral Given 10/23/14 1256)  ondansetron (ZOFRAN-ODT) disintegrating tablet 4 mg (4 mg Oral Given 10/23/14 1256)    58 y.o.Timothy Odonnell's evaluation in the Emergency Department is complete. It has been determined that no acute conditions requiring further emergency intervention are present at this time. The patient/guardian have been advised of the diagnosis and plan. We have discussed signs and symptoms that warrant return to the ED, such as changes or worsening in symptoms.  Vital signs are stable at discharge. Filed Vitals:   10/23/14 1146  BP: 142/98  Pulse: 83  Temp: 97.7 F (36.5 C)  Resp: 20    Patient/guardian has voiced understanding and agreed to follow-up with the PCP or specialist.   Marlon Peliffany Chestina Komatsu, PA-C 10/23/14 1419  Cathren LaineKevin Steinl, MD 10/23/14 [phone]

## 2014-12-04 ENCOUNTER — Ambulatory Visit: Payer: Self-pay | Admitting: Physician Assistant

## 2014-12-04 NOTE — H&P (Signed)
TOTAL KNEE ADMISSION H&P  Patient is being admitted for right total knee arthroplasty.  Subjective:  Chief Complaint:right knee pain.  HPI: Timothy Odonnell, 59 y.o. male, has a history of pain and functional disability in the right knee due to arthritis and has failed non-surgical conservative treatments for greater than 12 weeks to includeNSAID's and/or analgesics, corticosteriod injections, use of assistive devices and activity modification.  Onset of symptoms was gradual, starting 5 years ago with gradually worsening course since that time. The patient noted no past surgery on the right knee(s).  Patient currently rates pain in the right knee(s) at 8 out of 10 with activity. Patient has night pain, worsening of pain with activity and weight bearing, pain that interferes with activities of daily living, pain with passive range of motion, crepitus and joint swelling.  Patient has evidence of periarticular osteophytes and joint space narrowing by imaging studies. There is no active infection.  Patient Active Problem List   Diagnosis Date Noted  . BARRETTS ESOPHAGUS 02/02/2010  . ALCOHOL ABUSE 01/22/2010  . ALCOHOLIC HEPATITIS 01/06/2010  . LOSS OF WEIGHT 11/25/2009  . FATTY LIVER DISEASE 10/27/2009  . VITAMIN D DEFICIENCY 09/29/2009  . ELECTROCARDIOGRAM, ABNORMAL 09/26/2009  . LIVER FUNCTION TESTS, ABNORMAL, HX OF 09/19/2009  . PSYCHIATRIC DISORDER 08/26/2009  . FATIGUE 10/23/2008  . HYPERLIPIDEMIA 11/24/2006  . HYPERTENSION 11/24/2006  . GERD 11/24/2006  . OSTEOARTHRITIS 11/24/2006  . RETARDATION, MENTAL, MILD 11/07/2006   No past medical history on file.  No past surgical history on file.   (Not in a hospital admission) Allergies  Allergen Reactions  . Sulfamethoxazole-Trimethoprim Other (See Comments)    Unknown     History  Substance Use Topics  . Smoking status: Never Smoker   . Smokeless tobacco: Not on file  . Alcohol Use: Yes    Family History  Problem Relation Age  of Onset  . Diabetes Mother   . Emphysema Father      Review of Systems  Constitutional: Negative.   HENT: Negative.   Eyes: Negative.   Respiratory: Negative.   Cardiovascular: Positive for leg swelling. Negative for chest pain, palpitations, orthopnea, claudication and PND.  Gastrointestinal: Negative.   Genitourinary: Negative.   Musculoskeletal: Positive for joint pain. Negative for falls.  Skin: Negative.   Neurological: Negative.   Endo/Heme/Allergies: Negative.   Psychiatric/Behavioral: Negative.     Objective:  Physical Exam  Constitutional: He is oriented to person, place, and time. He appears well-developed and well-nourished. No distress.  HENT:  Head: Normocephalic and atraumatic.  Nose: Nose normal.  Eyes: Conjunctivae and EOM are normal. Pupils are equal, round, and reactive to light.  Neck: Normal range of motion. Neck supple.  Cardiovascular: Normal rate, regular rhythm, normal heart sounds and intact distal pulses.   Respiratory: Effort normal and breath sounds normal. No respiratory distress. He has no wheezes.  GI: Soft. Bowel sounds are normal. He exhibits no distension. There is no tenderness.  Musculoskeletal:       Right knee: He exhibits swelling and effusion. He exhibits no erythema, no LCL laxity and no MCL laxity. Tenderness found.  Lymphadenopathy:    He has no cervical adenopathy.  Neurological: He is alert and oriented to person, place, and time. No cranial nerve deficit.  Skin: Skin is warm and dry. No rash noted. No erythema.  Psychiatric: He has a normal mood and affect. His behavior is normal.    Vital signs in last 24 hours: @VSRANGES @  Labs:  Estimated body mass index is 27.59 kg/(m^2) as calculated from the following:   Height as of 12/01/13: 6' 2" (1.88 m).   Weight as of 12/01/13: 97.523 kg (215 lb).   Imaging Review Plain radiographs demonstrate severe degenerative joint disease of the right knee(s). The overall alignment  issignificant valgus. The bone quality appears to be good for age and reported activity level.  Assessment/Plan:  End stage arthritis, right knee   The patient history, physical examination, clinical judgment of the provider and imaging studies are consistent with end stage degenerative joint disease of the right knee(s) and total knee arthroplasty is deemed medically necessary. The treatment options including medical management, injection therapy arthroscopy and arthroplasty were discussed at length. The risks and benefits of total knee arthroplasty were presented and reviewed. The risks due to aseptic loosening, infection, stiffness, patella tracking problems, thromboembolic complications and other imponderables were discussed. The patient acknowledged the explanation, agreed to proceed with the plan and consent was signed. Patient is being admitted for inpatient treatment for surgery, pain control, PT, OT, prophylactic antibiotics, VTE prophylaxis, progressive ambulation and ADL's and discharge planning. The patient is planning to be discharged home with home health services   

## 2014-12-09 ENCOUNTER — Ambulatory Visit (HOSPITAL_COMMUNITY)
Admission: RE | Admit: 2014-12-09 | Discharge: 2014-12-09 | Disposition: A | Discharge status: Home / Self Care | Payer: Medicaid Other | Source: Ambulatory Visit | Attending: Physician Assistant | Admitting: Physician Assistant

## 2014-12-09 ENCOUNTER — Encounter (HOSPITAL_COMMUNITY): Payer: Self-pay

## 2014-12-09 ENCOUNTER — Encounter (HOSPITAL_COMMUNITY)
Admission: RE | Admit: 2014-12-09 | Discharge: 2014-12-09 | Disposition: A | Discharge status: Home / Self Care | Payer: Medicaid Other | Source: Ambulatory Visit | Attending: Orthopedic Surgery | Admitting: Orthopedic Surgery

## 2014-12-09 DIAGNOSIS — Z0183 Encounter for blood typing: Secondary | ICD-10-CM | POA: Insufficient documentation

## 2014-12-09 DIAGNOSIS — Z01812 Encounter for preprocedural laboratory examination: Secondary | ICD-10-CM | POA: Insufficient documentation

## 2014-12-09 DIAGNOSIS — F17200 Nicotine dependence, unspecified, uncomplicated: Secondary | ICD-10-CM | POA: Insufficient documentation

## 2014-12-09 DIAGNOSIS — Z01818 Encounter for other preprocedural examination: Secondary | ICD-10-CM | POA: Insufficient documentation

## 2014-12-09 DIAGNOSIS — M1711 Unilateral primary osteoarthritis, right knee: Secondary | ICD-10-CM

## 2014-12-09 DIAGNOSIS — I4510 Unspecified right bundle-branch block: Secondary | ICD-10-CM | POA: Diagnosis not present

## 2014-12-09 DIAGNOSIS — J449 Chronic obstructive pulmonary disease, unspecified: Secondary | ICD-10-CM | POA: Insufficient documentation

## 2014-12-09 DIAGNOSIS — I4891 Unspecified atrial fibrillation: Secondary | ICD-10-CM | POA: Diagnosis not present

## 2014-12-09 HISTORY — DX: Unspecified osteoarthritis, unspecified site: M19.90

## 2014-12-09 LAB — URINALYSIS, ROUTINE W REFLEX MICROSCOPIC
Bilirubin Urine: NEGATIVE
Glucose, UA: NEGATIVE mg/dL
KETONES UR: NEGATIVE mg/dL
Leukocytes, UA: NEGATIVE
Nitrite: NEGATIVE
PH: 5.5 (ref 5.0–8.0)
Protein, ur: NEGATIVE mg/dL
SPECIFIC GRAVITY, URINE: 1.015 (ref 1.005–1.030)
Urobilinogen, UA: 0.2 mg/dL (ref 0.0–1.0)

## 2014-12-09 LAB — CBC WITH DIFFERENTIAL/PLATELET
BASOS PCT: 0 % (ref 0–1)
Basophils Absolute: 0 10*3/uL (ref 0.0–0.1)
Eosinophils Absolute: 0.1 10*3/uL (ref 0.0–0.7)
Eosinophils Relative: 1 % (ref 0–5)
HEMATOCRIT: 36.7 % — AB (ref 39.0–52.0)
Hemoglobin: 12.8 g/dL — ABNORMAL LOW (ref 13.0–17.0)
Lymphocytes Relative: 26 % (ref 12–46)
Lymphs Abs: 1.4 10*3/uL (ref 0.7–4.0)
MCH: 29.7 pg (ref 26.0–34.0)
MCHC: 34.9 g/dL (ref 30.0–36.0)
MCV: 85.2 fL (ref 78.0–100.0)
MONO ABS: 0.5 10*3/uL (ref 0.1–1.0)
Monocytes Relative: 10 % (ref 3–12)
NEUTROS ABS: 3.4 10*3/uL (ref 1.7–7.7)
Neutrophils Relative %: 63 % (ref 43–77)
Platelets: 243 10*3/uL (ref 150–400)
RBC: 4.31 MIL/uL (ref 4.22–5.81)
RDW: 14.4 % (ref 11.5–15.5)
WBC: 5.4 10*3/uL (ref 4.0–10.5)

## 2014-12-09 LAB — COMPREHENSIVE METABOLIC PANEL
ALK PHOS: 83 U/L (ref 38–126)
ALT: 15 U/L — ABNORMAL LOW (ref 17–63)
AST: 29 U/L (ref 15–41)
Albumin: 3.8 g/dL (ref 3.5–5.0)
Anion gap: 11 (ref 5–15)
BUN: 8 mg/dL (ref 6–20)
CALCIUM: 9.3 mg/dL (ref 8.9–10.3)
CO2: 23 mmol/L (ref 22–32)
CREATININE: 1.05 mg/dL (ref 0.61–1.24)
Chloride: 104 mmol/L (ref 101–111)
GLUCOSE: 89 mg/dL (ref 65–99)
Potassium: 3.7 mmol/L (ref 3.5–5.1)
Sodium: 138 mmol/L (ref 135–145)
Total Bilirubin: 0.7 mg/dL (ref 0.3–1.2)
Total Protein: 8.1 g/dL (ref 6.5–8.1)

## 2014-12-09 LAB — SURGICAL PCR SCREEN
MRSA, PCR: NEGATIVE
Staphylococcus aureus: NEGATIVE

## 2014-12-09 LAB — PROTIME-INR
INR: 1.06 (ref 0.00–1.49)
PROTHROMBIN TIME: 14 s (ref 11.6–15.2)

## 2014-12-09 LAB — URINE MICROSCOPIC-ADD ON

## 2014-12-09 LAB — TYPE AND SCREEN
ABO/RH(D): B POS
Antibody Screen: NEGATIVE

## 2014-12-09 LAB — ABO/RH: ABO/RH(D): B POS

## 2014-12-09 LAB — APTT: aPTT: 32 seconds (ref 24–37)

## 2014-12-09 NOTE — Pre-Procedure Instructions (Signed)
    Tally JoeLuther W Rosamilia  12/09/2014      [id number] [place]- [place], [place] - [address] [place] [id number] Phone: [phone] Fax: [phone]    Your procedure is scheduled on 12/20/14.  [facility]  Call this number if you have problems the morning of surgery:  [phone]   Remember:  Do not eat food or drink liquids after midnight.  Take these medicines the morning of surgery with A SIP OF WATER -hydrocodone   Do not wear jewelry, make-up or nail polish.  Do not wear lotions, powders, or perfumes.  You may wear deodorant.  Do not shave 48 hours prior to surgery.  Men may shave face and neck.  Do not bring valuables to the hospital.  [facility] is [facility] for any belongings or valuables.  Contacts, dentures or bridgework may not be worn into surgery.  Leave your suitcase in the car.  After surgery it may be brought to your room.  For patients admitted to the hospital, discharge time will be determined by your treatment team.  Patients discharged the day of surgery will not be allowed to drive home.   Name and phone number of your driver:    Special instructions:    Please read over the following fact sheets that you were given. Pain Booklet, Coughing and Deep Breathing, Blood Transfusion Information, MRSA Information and Surgical Site Infection Prevention

## 2014-12-10 LAB — URINE CULTURE: Culture: NO GROWTH

## 2014-12-10 NOTE — Progress Notes (Addendum)
Anesthesia Chart Review: Patient is a 59 year old male scheduled for right TKA on 12/20/14 by Dr. Madelon Lipsaffrey.  History includes smoking, arthritis, ETOH use. No PCP per patient. He was seen by Tereso NewcomerScott Weaver, PA-C back in 2011 and had an echo done for possible inferior Q waves. Lorin PicketScott is now a PA with [facility], but based on notes I think these visits may have been when he worked with [facility].  Meds include BC Headache Powder, Voltaren, Norco.  12/09/14 EKG: Afib at 64 bpm, incomplete right BBB. Afib appears new when compared to 09/26/09 EKGs. I was not notified of his EKG findings or asked to evaluate patient during his PAT visit.   10/01/09 Echo: Study Conclusions: Left ventricle: There are no definite focal wall abnormalities. The cavity size was normal. Wall thickness was normal. The estimatedejection fraction was 55%.   12/09/14 CXR: IMPRESSION: COPD with mild pulmonary fibrotic change. There is no acute cardiopulmonary abnormality.  Preoperative labs noted.   Patient with afib of undetermined duration. Rate controlled at PAT. He will need a cardiology pre-operative evaluation. I have notified Tresa EndoKelly at Dr. Candise Bowensaffrey's office. She will contact patient and arrange cardiology visit.  Velna Ochsllison Florine Sprenkle, PA-C [facility] Phone [phone] 12/10/2014 5:47 PM  Addendum: [facility] by Rudi Cocoonna Carroll, NP on 12/12/14 for afib and pre-operative evaluation. CHADSVASC score of 1, so no long term anticoagulants recommended at this time.   12/13/14 Echo:  Study Conclusions - Left ventricle: LV global longitudinal strain is -16.6% The cavity size was normal. Systolic function was normal. The estimated ejection fraction was in the range of 50% to 55%. Wall motion was normal; there were no regional wall motion abnormalities. - Aortic valve: Trileaflet; normal thickness, mildly calcified leaflets. - Mitral valve: There was trivial  regurgitation.  Patient was suppose to follow-up with Rudi Cocoonna Carroll today to review echo results, but he could not arrange transportation; however, I received a phone call from Ventura SellersStacy Carter, RN with Noralyn Pickarroll, NP who said that based on his evaluation from 12/12/14 and his recent echo that patient was being cleared from a cardiac standpoint for planned procedure. They are planning to send something in witting to Dr. Candise Bowensaffrey's office and, in turn, I asked them to fax to us.   Velna Ochsllison Giovanna Kemmerer, PA-C [facility] Phone [phone] 12/17/2014 4:26 PM

## 2014-12-12 ENCOUNTER — Ambulatory Visit (HOSPITAL_COMMUNITY)
Admission: RE | Admit: 2014-12-12 | Discharge: 2014-12-12 | Disposition: A | Discharge status: Home / Self Care | Payer: Medicaid Other | Source: Ambulatory Visit | Attending: Nurse Practitioner | Admitting: Nurse Practitioner

## 2014-12-12 ENCOUNTER — Encounter (HOSPITAL_COMMUNITY): Payer: Self-pay | Admitting: Nurse Practitioner

## 2014-12-12 VITALS — BP 148/100 | HR 81 | Ht 74.0 in | Wt 206.4 lb

## 2014-12-12 DIAGNOSIS — I10 Essential (primary) hypertension: Secondary | ICD-10-CM | POA: Diagnosis not present

## 2014-12-12 DIAGNOSIS — I480 Paroxysmal atrial fibrillation: Secondary | ICD-10-CM | POA: Insufficient documentation

## 2014-12-13 ENCOUNTER — Encounter (HOSPITAL_COMMUNITY): Payer: Self-pay | Admitting: Nurse Practitioner

## 2014-12-13 ENCOUNTER — Ambulatory Visit (HOSPITAL_COMMUNITY)
Admission: RE | Admit: 2014-12-13 | Discharge: 2014-12-13 | Disposition: A | Discharge status: Home / Self Care | Payer: Medicaid Other | Source: Ambulatory Visit | Attending: Nurse Practitioner | Admitting: Nurse Practitioner

## 2014-12-13 DIAGNOSIS — I4891 Unspecified atrial fibrillation: Secondary | ICD-10-CM | POA: Diagnosis present

## 2014-12-13 DIAGNOSIS — I480 Paroxysmal atrial fibrillation: Secondary | ICD-10-CM

## 2014-12-13 DIAGNOSIS — I358 Other nonrheumatic aortic valve disorders: Secondary | ICD-10-CM | POA: Insufficient documentation

## 2014-12-13 DIAGNOSIS — F17200 Nicotine dependence, unspecified, uncomplicated: Secondary | ICD-10-CM | POA: Diagnosis not present

## 2014-12-13 DIAGNOSIS — I340 Nonrheumatic mitral (valve) insufficiency: Secondary | ICD-10-CM | POA: Insufficient documentation

## 2014-12-13 NOTE — Progress Notes (Signed)
Patient ID: Timothy Odonnell, male   DOB: 02/10/1956, 59 y.o.   MRN: [id number]      Primary Care Physician: No PCP Per Patient Referring Physician:   AMANDEEP NESMITH is a 59 y.o. male with a h/o alcohol abuse, psychiatric disorder, HTN, that recently presented for pre-op evaluation for rt knee replacement and found to be in afib with slow ventricular respons and was asked to be evaluated in the afib clinic.   Pt is unaware of any irregular heart beat. Denies  spells of weakness, h/o syncope, fatigue, shortness of breath. Smokes a cigar on an occasional basis.  Denies any issues with swelling or heart failure. Does not report any chest pain with exertional activities. Has never had a cardiac evaluation.States he use to drink but doesn't have much money to drink much any more, maybe 2 beers a week. Denies any snoring issues or symptoms consistent with sleep apnea. He is in SR today. He is in a lot of pain with his rt knee and is walking with a crutch, walking any distance is limited. Chadsvasc score is 1 for HTN, by guidelines does not require anticoagulants. Poor historian.  Today, he denies symptoms of palpitations, chest pain, shortness of breath, orthopnea, PND, lower extremity edema, dizziness, presyncope, syncope, or neurologic sequela. The patient is tolerating medications without difficulties and is otherwise without complaint today.   Past Medical History  Diagnosis Date  . Arthritis    Past Surgical History  Procedure Laterality Date  . No past surgeries      Current Outpatient Prescriptions  Medication Sig Dispense Refill  . Aspirin-Salicylamide-Caffeine (BC HEADACHE POWDER PO) Take 1 tablet by mouth 2 (two) times daily as needed (for knee pain).     . sodium chloride (OCEAN) 0.65 % SOLN nasal spray Place 1-2 sprays into both nostrils daily as needed for congestion.    . diclofenac (VOLTAREN) 75 MG EC tablet Take 1 tablet (75 mg total) by mouth 2 (two) times daily. 60 tablet 0  .  HYDROcodone-acetaminophen (NORCO/VICODIN) 5-325 MG per tablet Take 1 tablet by mouth every 4 (four) hours as needed. 10 tablet 0   No current facility-administered medications for this encounter.    Allergies  Allergen Reactions  . Sulfamethoxazole-Trimethoprim Other (See Comments)    Unknown     History   Social History  . Marital Status: Single    Spouse Name: N/A  . Number of Children: N/A  . Years of Education: N/A   Occupational History  . Not on file.   Social History Main Topics  . Smoking status: Current Some Day Smoker  . Smokeless tobacco: Not on file  . Alcohol Use: Yes  . Drug Use: Yes    Special: Benzodiazepines  . Sexual Activity: Not on file   Other Topics Concern  . Not on file   Social History Narrative    Family History  Problem Relation Age of Onset  . Diabetes Mother   . Emphysema Father     ROS- All systems are reviewed and negative except as per the HPI above  Physical Exam: Filed Vitals:   12/12/14 1433  BP: 148/100  Pulse: 81  Height:  (1.88 m)  Weight: 206 lb 6.4 oz (93.622 kg)    GEN- The patient is well appearing, alert and oriented x 3 today.   Head- normocephalic, atraumatic Eyes-  Sclera clear, conjunctiva pink Ears- hearing intact Oropharynx- clear Neck- supple, no JVP Lymph- no cervical lymphadenopathy Lungs- Clear  to ausculation bilaterally, normal work of breathing Heart- Regular rate and rhythm, no murmurs, rubs or gallops, PMI not laterally displaced GI- soft, NT, ND, + BS Extremities- no clubbing, cyanosis, or edema, rt knee enlarged and slightly swollen. MS- no significant deformity or atrophy Skin- no rash or lesion Psych- euthymic mood, full affect Neuro- strength and sensation are intact  EKG-NSR, normal EKG, Pr int 198 ms, QRS 100 ms, QTc 471 ms.  Assessment and Plan: 1. PAF with controlled v response, asymptomatic. In SR today Pt reports no symptoms of afib episodes  Will obtain echo  Friday Chadsvasc score of 1,(htn) does not require anticoagulants by guidelines  2. HTN Not on any meds for BP Elevated today Avoid salt  Antiinflammatories may be contributing May need additional evaluation after surgery.  F/u after echo on Tuesday, 7/26 and hopefully pt will be low risk to proceed to surgery which is scheduled 7/29.

## 2014-12-13 NOTE — Progress Notes (Signed)
Echocardiogram 2D Echocardiogram has been performed.  Timothy Odonnell 12/13/2014, 3:07 PM

## 2014-12-16 ENCOUNTER — Telehealth (HOSPITAL_COMMUNITY): Payer: Self-pay

## 2014-12-16 NOTE — Telephone Encounter (Signed)
Pt stated couldn't come to appt tomorrow 12/17/14 due to lack of transportation. Gave pt options to reschedule,pt refused to come in.

## 2014-12-17 ENCOUNTER — Inpatient Hospital Stay (HOSPITAL_COMMUNITY): Admission: RE | Admit: 2014-12-17 | Payer: Medicaid Other | Source: Ambulatory Visit | Admitting: Nurse Practitioner

## 2014-12-19 MED ORDER — TRANEXAMIC ACID 1000 MG/10ML IV SOLN
1000.0000 mg | INTRAVENOUS | Status: AC
  Administered 2014-12-20: 1000 mg via INTRAVENOUS
  Filled 2014-12-19: qty 10

## 2014-12-19 MED ORDER — SODIUM CHLORIDE 0.9 % IV SOLN: INTRAVENOUS | Status: DC

## 2014-12-19 MED ORDER — CEFAZOLIN SODIUM-DEXTROSE 2-3 GM-% IV SOLR
2.0000 g | INTRAVENOUS | Status: AC
  Administered 2014-12-20: 2 g via INTRAVENOUS
  Filled 2014-12-19: qty 50

## 2014-12-20 ENCOUNTER — Encounter (HOSPITAL_COMMUNITY)
Admission: RE | Disposition: A | Discharge status: Home Health | Payer: Self-pay | Source: Ambulatory Visit | Attending: Orthopedic Surgery

## 2014-12-20 ENCOUNTER — Inpatient Hospital Stay (HOSPITAL_COMMUNITY): Payer: Medicaid Other | Admitting: Anesthesiology

## 2014-12-20 ENCOUNTER — Encounter (HOSPITAL_COMMUNITY): Payer: Self-pay | Admitting: *Deleted

## 2014-12-20 ENCOUNTER — Inpatient Hospital Stay (HOSPITAL_COMMUNITY)
Admission: RE | Admit: 2014-12-20 | Discharge: 2014-12-21 | DRG: 470 | Disposition: A | Discharge status: Home Health | Payer: Medicaid Other | Source: Ambulatory Visit | Attending: Orthopedic Surgery | Admitting: Orthopedic Surgery

## 2014-12-20 ENCOUNTER — Inpatient Hospital Stay (HOSPITAL_COMMUNITY): Payer: Medicaid Other | Admitting: Vascular Surgery

## 2014-12-20 DIAGNOSIS — E785 Hyperlipidemia, unspecified: Secondary | ICD-10-CM | POA: Diagnosis present

## 2014-12-20 DIAGNOSIS — F70 Mild intellectual disabilities: Secondary | ICD-10-CM | POA: Diagnosis present

## 2014-12-20 DIAGNOSIS — M1711 Unilateral primary osteoarthritis, right knee: Secondary | ICD-10-CM | POA: Diagnosis present

## 2014-12-20 DIAGNOSIS — I441 Atrioventricular block, second degree: Secondary | ICD-10-CM | POA: Diagnosis not present

## 2014-12-20 DIAGNOSIS — M25561 Pain in right knee: Secondary | ICD-10-CM | POA: Diagnosis present

## 2014-12-20 DIAGNOSIS — K219 Gastro-esophageal reflux disease without esophagitis: Secondary | ICD-10-CM | POA: Diagnosis present

## 2014-12-20 DIAGNOSIS — I10 Essential (primary) hypertension: Secondary | ICD-10-CM | POA: Diagnosis present

## 2014-12-20 HISTORY — PX: TOTAL KNEE ARTHROPLASTY: SHX125

## 2014-12-20 SURGERY — ARTHROPLASTY, KNEE, TOTAL
Anesthesia: Regional | Site: Knee | Laterality: Right

## 2014-12-20 MED ORDER — PROPOFOL INFUSION 10 MG/ML OPTIME
INTRAVENOUS | Status: DC | PRN
  Administered 2014-12-20: 50 ug/kg/min via INTRAVENOUS

## 2014-12-20 MED ORDER — DOCUSATE SODIUM 100 MG PO CAPS
100.0000 mg | ORAL_CAPSULE | Freq: Two times a day (BID) | ORAL | Status: DC
  Administered 2014-12-20 – 2014-12-21 (×2): 100 mg via ORAL
  Filled 2014-12-20 (×3): qty 1

## 2014-12-20 MED ORDER — ONDANSETRON HCL 4 MG/2ML IJ SOLN: 4.0000 mg | Freq: Four times a day (QID) | INTRAMUSCULAR | Status: DC | PRN

## 2014-12-20 MED ORDER — 0.9 % SODIUM CHLORIDE (POUR BTL) OPTIME
TOPICAL | Status: DC | PRN
  Administered 2014-12-20: 1000 mL

## 2014-12-20 MED ORDER — SENNOSIDES-DOCUSATE SODIUM 8.6-50 MG PO TABS: 1.0000 | ORAL_TABLET | Freq: Every evening | ORAL | Status: DC | PRN

## 2014-12-20 MED ORDER — MIDAZOLAM HCL 2 MG/2ML IJ SOLN
INTRAMUSCULAR | Status: AC
  Filled 2014-12-20: qty 2

## 2014-12-20 MED ORDER — MENTHOL 3 MG MT LOZG: 1.0000 | LOZENGE | OROMUCOSAL | Status: DC | PRN

## 2014-12-20 MED ORDER — BISACODYL 5 MG PO TBEC: 5.0000 mg | DELAYED_RELEASE_TABLET | Freq: Every day | ORAL | Status: DC | PRN

## 2014-12-20 MED ORDER — APIXABAN 2.5 MG PO TABS
2.5000 mg | ORAL_TABLET | Freq: Two times a day (BID) | ORAL | Status: DC
Start: 2014-12-21 — End: 2014-12-21
  Administered 2014-12-21: 2.5 mg via ORAL
  Filled 2014-12-20: qty 1

## 2014-12-20 MED ORDER — LIDOCAINE HCL (CARDIAC) 20 MG/ML IV SOLN
INTRAVENOUS | Status: AC
  Filled 2014-12-20: qty 5

## 2014-12-20 MED ORDER — ALUM & MAG HYDROXIDE-SIMETH 200-200-20 MG/5ML PO SUSP: 30.0000 mL | ORAL | Status: DC | PRN

## 2014-12-20 MED ORDER — ONDANSETRON HCL 4 MG/2ML IJ SOLN
INTRAMUSCULAR | Status: DC | PRN
  Administered 2014-12-20: 4 mg via INTRAVENOUS

## 2014-12-20 MED ORDER — METOCLOPRAMIDE HCL 5 MG/ML IJ SOLN: 5.0000 mg | Freq: Three times a day (TID) | INTRAMUSCULAR | Status: DC | PRN

## 2014-12-20 MED ORDER — SODIUM CHLORIDE 0.9 % IR SOLN
Status: DC | PRN
  Administered 2014-12-20: 3000 mL

## 2014-12-20 MED ORDER — OXYCODONE HCL 5 MG PO TABS
5.0000 mg | ORAL_TABLET | ORAL | Status: DC | PRN
  Administered 2014-12-20 – 2014-12-21 (×7): 10 mg via ORAL
  Filled 2014-12-20 (×7): qty 2

## 2014-12-20 MED ORDER — FLEET ENEMA 7-19 GM/118ML RE ENEM: 1.0000 | ENEMA | Freq: Once | RECTAL | Status: AC | PRN

## 2014-12-20 MED ORDER — METHOCARBAMOL 500 MG PO TABS
500.0000 mg | ORAL_TABLET | Freq: Four times a day (QID) | ORAL | Status: DC | PRN
  Administered 2014-12-20 – 2014-12-21 (×3): 500 mg via ORAL
  Filled 2014-12-20 (×3): qty 1

## 2014-12-20 MED ORDER — APIXABAN 2.5 MG PO TABS: 2.5000 mg | ORAL_TABLET | Freq: Two times a day (BID) | ORAL | Status: DC

## 2014-12-20 MED ORDER — METOCLOPRAMIDE HCL 5 MG PO TABS: 5.0000 mg | ORAL_TABLET | Freq: Three times a day (TID) | ORAL | Status: DC | PRN

## 2014-12-20 MED ORDER — ACETAMINOPHEN 325 MG PO TABS: 650.0000 mg | ORAL_TABLET | Freq: Four times a day (QID) | ORAL | Status: DC | PRN

## 2014-12-20 MED ORDER — ONDANSETRON HCL 4 MG PO TABS: 4.0000 mg | ORAL_TABLET | Freq: Four times a day (QID) | ORAL | Status: DC | PRN

## 2014-12-20 MED ORDER — ACETAMINOPHEN 650 MG RE SUPP: 650.0000 mg | Freq: Four times a day (QID) | RECTAL | Status: DC | PRN

## 2014-12-20 MED ORDER — LACTATED RINGERS IV SOLN
INTRAVENOUS | Status: DC | PRN
  Administered 2014-12-20 (×2): via INTRAVENOUS

## 2014-12-20 MED ORDER — OXYCODONE HCL 5 MG PO TABS: ORAL_TABLET | ORAL | Status: DC

## 2014-12-20 MED ORDER — EPHEDRINE SULFATE 50 MG/ML IJ SOLN
INTRAMUSCULAR | Status: AC
  Filled 2014-12-20: qty 1

## 2014-12-20 MED ORDER — DIPHENHYDRAMINE HCL 12.5 MG/5ML PO ELIX
12.5000 mg | ORAL_SOLUTION | ORAL | Status: DC | PRN
Start: 2014-12-20 — End: 2014-12-21

## 2014-12-20 MED ORDER — CEFAZOLIN SODIUM 1-5 GM-% IV SOLN
1.0000 g | Freq: Four times a day (QID) | INTRAVENOUS | Status: AC
  Administered 2014-12-20 (×2): 1 g via INTRAVENOUS
  Filled 2014-12-20 (×2): qty 50

## 2014-12-20 MED ORDER — MIDAZOLAM HCL 5 MG/5ML IJ SOLN
INTRAMUSCULAR | Status: DC | PRN
  Administered 2014-12-20: 2 mg via INTRAVENOUS

## 2014-12-20 MED ORDER — PHENOL 1.4 % MT LIQD: 1.0000 | OROMUCOSAL | Status: DC | PRN

## 2014-12-20 MED ORDER — FENTANYL CITRATE (PF) 100 MCG/2ML IJ SOLN
INTRAMUSCULAR | Status: DC | PRN
  Administered 2014-12-20 (×3): 25 ug via INTRAVENOUS
  Administered 2014-12-20 (×2): 50 ug via INTRAVENOUS

## 2014-12-20 MED ORDER — HYDROMORPHONE HCL 1 MG/ML IJ SOLN: 1.0000 mg | INTRAMUSCULAR | Status: DC | PRN

## 2014-12-20 MED ORDER — SUCCINYLCHOLINE CHLORIDE 20 MG/ML IJ SOLN
INTRAMUSCULAR | Status: AC
  Filled 2014-12-20: qty 1

## 2014-12-20 MED ORDER — FENTANYL CITRATE (PF) 250 MCG/5ML IJ SOLN
INTRAMUSCULAR | Status: AC
Start: 2014-12-20 — End: 2014-12-20
  Filled 2014-12-20: qty 5

## 2014-12-20 MED ORDER — SODIUM CHLORIDE 0.9 % IJ SOLN
INTRAMUSCULAR | Status: AC
  Filled 2014-12-20: qty 10

## 2014-12-20 MED ORDER — SODIUM CHLORIDE 0.9 % IV SOLN
INTRAVENOUS | Status: DC
  Administered 2014-12-20 (×2): via INTRAVENOUS

## 2014-12-20 MED ORDER — ONDANSETRON HCL 4 MG/2ML IJ SOLN
INTRAMUSCULAR | Status: AC
  Filled 2014-12-20: qty 2

## 2014-12-20 MED ORDER — ROCURONIUM BROMIDE 50 MG/5ML IV SOLN
INTRAVENOUS | Status: AC
  Filled 2014-12-20: qty 1

## 2014-12-20 MED ORDER — METHOCARBAMOL 1000 MG/10ML IJ SOLN
500.0000 mg | Freq: Four times a day (QID) | INTRAMUSCULAR | Status: DC | PRN
  Filled 2014-12-20: qty 5

## 2014-12-20 MED ORDER — PROPOFOL 10 MG/ML IV BOLUS
INTRAVENOUS | Status: AC
  Filled 2014-12-20: qty 20

## 2014-12-20 SURGICAL SUPPLY — 67 items
ANCH SUT 2 CP-2 EBND QANCHR+ (Anchor) ×1 IMPLANT
ANCHOR SUPER QUICK (Anchor) ×2 IMPLANT
BANDAGE ELASTIC 4 VELCRO ST LF (GAUZE/BANDAGES/DRESSINGS) ×2 IMPLANT
BANDAGE ELASTIC 6 VELCRO ST LF (GAUZE/BANDAGES/DRESSINGS) ×2 IMPLANT
BANDAGE ESMARK 6X9 LF (GAUZE/BANDAGES/DRESSINGS) ×1 IMPLANT
BLADE SAGITTAL 25.0X1.19X90 (BLADE) ×2 IMPLANT
BLADE SAW SAG 90X13X1.27 (BLADE) ×2 IMPLANT
BNDG CMPR 9X6 STRL LF SNTH (GAUZE/BANDAGES/DRESSINGS) ×1
BNDG ESMARK 6X9 LF (GAUZE/BANDAGES/DRESSINGS) ×2
BOWL SMART MIX CTS (DISPOSABLE) ×2 IMPLANT
CAP KNEE TOTAL 3 SIGMA ×2 IMPLANT
CEMENT HV SMART SET (Cement) ×2 IMPLANT
COVER SURGICAL LIGHT HANDLE (MISCELLANEOUS) ×2 IMPLANT
CUFF TOURNIQUET SINGLE 34IN LL (TOURNIQUET CUFF) ×2 IMPLANT
CUFF TOURNIQUET SINGLE 44IN (TOURNIQUET CUFF) IMPLANT
DRAPE IMP U-DRAPE 54X76 (DRAPES) ×2 IMPLANT
DRAPE INCISE IOBAN 66X45 STRL (DRAPES) IMPLANT
DRAPE ORTHO SPLIT 77X108 STRL (DRAPES) ×4
DRAPE SURG ORHT 6 SPLT 77X108 (DRAPES) ×2 IMPLANT
DRAPE U-SHAPE 47X51 STRL (DRAPES) ×2 IMPLANT
DRSG ADAPTIC 3X8 NADH LF (GAUZE/BANDAGES/DRESSINGS) ×2 IMPLANT
DRSG PAD ABDOMINAL 8X10 ST (GAUZE/BANDAGES/DRESSINGS) ×3 IMPLANT
DURAPREP 26ML APPLICATOR (WOUND CARE) ×2 IMPLANT
ELECT REM PT RETURN 9FT ADLT (ELECTROSURGICAL) ×2
ELECTRODE REM PT RTRN 9FT ADLT (ELECTROSURGICAL) ×1 IMPLANT
EVACUATOR 1/8 PVC DRAIN (DRAIN) IMPLANT
FACESHIELD WRAPAROUND (MASK) ×4 IMPLANT
FLOSEAL 10ML (HEMOSTASIS) IMPLANT
GAUZE SPONGE 4X4 12PLY STRL (GAUZE/BANDAGES/DRESSINGS) ×2 IMPLANT
GLOVE BIOGEL PI IND STRL 8 (GLOVE) ×4 IMPLANT
GLOVE BIOGEL PI INDICATOR 8 (GLOVE) ×4
GLOVE ORTHO TXT STRL SZ7.5 (GLOVE) ×6 IMPLANT
GLOVE SURG ORTHO 8.0 STRL STRW (GLOVE) ×6 IMPLANT
GOWN STRL REUS W/ TWL LRG LVL3 (GOWN DISPOSABLE) ×2 IMPLANT
GOWN STRL REUS W/ TWL XL LVL3 (GOWN DISPOSABLE) ×1 IMPLANT
GOWN STRL REUS W/TWL 2XL LVL3 (GOWN DISPOSABLE) ×2 IMPLANT
GOWN STRL REUS W/TWL LRG LVL3 (GOWN DISPOSABLE) ×4
GOWN STRL REUS W/TWL XL LVL3 (GOWN DISPOSABLE) ×2
HANDPIECE INTERPULSE COAX TIP (DISPOSABLE) ×2
HOOD PEEL AWAY FACE SHEILD DIS (HOOD) ×2 IMPLANT
IMMOBILIZER KNEE 22 UNIV (SOFTGOODS) ×2 IMPLANT
KIT BASIN OR (CUSTOM PROCEDURE TRAY) ×2 IMPLANT
KIT ROOM TURNOVER OR (KITS) ×2 IMPLANT
MANIFOLD NEPTUNE II (INSTRUMENTS) ×2 IMPLANT
NEEDLE 22X1 1/2 (OR ONLY) (NEEDLE) IMPLANT
NS IRRIG 1000ML POUR BTL (IV SOLUTION) ×2 IMPLANT
PACK TOTAL JOINT (CUSTOM PROCEDURE TRAY) ×2 IMPLANT
PACK UNIVERSAL I (CUSTOM PROCEDURE TRAY) ×2 IMPLANT
PAD ARMBOARD 7.5X6 YLW CONV (MISCELLANEOUS) ×4 IMPLANT
PAD CAST 4YDX4 CTTN HI CHSV (CAST SUPPLIES) ×1 IMPLANT
PADDING CAST COTTON 4X4 STRL (CAST SUPPLIES) ×2
PADDING CAST COTTON 6X4 STRL (CAST SUPPLIES) ×2 IMPLANT
SET HNDPC FAN SPRY TIP SCT (DISPOSABLE) ×1 IMPLANT
STAPLER VISISTAT 35W (STAPLE) ×2 IMPLANT
SUCTION FRAZIER TIP 10 FR DISP (SUCTIONS) ×2 IMPLANT
SUT ETHIBOND NAB CT1 #1 30IN (SUTURE) ×6 IMPLANT
SUT VIC AB 0 CT1 27 (SUTURE) ×2
SUT VIC AB 0 CT1 27XBRD ANBCTR (SUTURE) ×1 IMPLANT
SUT VIC AB 2-0 CT1 27 (SUTURE) ×4
SUT VIC AB 2-0 CT1 TAPERPNT 27 (SUTURE) ×2 IMPLANT
SYR CONTROL 10ML LL (SYRINGE) IMPLANT
TIP HIGH FLOW IRRIGATION COAX (MISCELLANEOUS) ×2 IMPLANT
TOWEL OR 17X24 6PK STRL BLUE (TOWEL DISPOSABLE) ×2 IMPLANT
TOWEL OR 17X26 10 PK STRL BLUE (TOWEL DISPOSABLE) ×2 IMPLANT
TRAY FOLEY CATH 16FRSI W/METER (SET/KITS/TRAYS/PACK) IMPLANT
TUBE CONNECTING 12X1/4 (SUCTIONS) ×2 IMPLANT
WATER STERILE IRR 1000ML POUR (IV SOLUTION) IMPLANT

## 2014-12-20 NOTE — Plan of Care (Signed)
Problem: Consults Goal: Diagnosis- Total Joint Replacement Primary Total Knee Left     

## 2014-12-20 NOTE — Anesthesia Preprocedure Evaluation (Addendum)
Anesthesia Evaluation  Patient identified by MRN, date of birth, ID band Patient awake    Reviewed: Allergy & Precautions, NPO status , Patient's Chart, lab work & pertinent test results  Airway Mallampati: II  TM Distance: >3 FB Neck ROM: Full    Dental no notable dental hx. (+) Dental Advisory Given   Pulmonary neg pulmonary ROS, Current Smoker,  breath sounds clear to auscultation  Pulmonary exam normal       Cardiovascular hypertension, Normal cardiovascular examRhythm:Regular Rate:Normal     Neuro/Psych PSYCHIATRIC DISORDERS negative neurological ROS  negative psych ROS   GI/Hepatic negative GI ROS, Neg liver ROS, GERD-  ,(+) Hepatitis -  Endo/Other  negative endocrine ROS  Renal/GU negative Renal ROS     Musculoskeletal  (+) Arthritis -,   Abdominal   Peds  Hematology negative hematology ROS (+)   Anesthesia Other Findings   Reproductive/Obstetrics negative OB ROS                          Anesthesia Physical Anesthesia Plan  ASA: II  Anesthesia Plan: Regional and Spinal   Post-op Pain Management:    Induction: Intravenous  Airway Management Planned:   Additional Equipment:   Intra-op Plan:   Post-operative Plan:   Informed Consent: I have reviewed the patients History and Physical, chart, labs and discussed the procedure including the risks, benefits and alternatives for the proposed anesthesia with the patient or authorized representative who has indicated his/her understanding and acceptance.   Dental advisory given  Plan Discussed with: CRNA  Anesthesia Plan Comments:         Anesthesia Quick Evaluation

## 2014-12-20 NOTE — Interval H&P Note (Signed)
History and Physical Interval Note:  12/20/2014 7:22 AM  Timothy Odonnell  has presented today for surgery, with the diagnosis of OA RIGHT KNEE  The various methods of treatment have been discussed with the patient and family. After consideration of risks, benefits and other options for treatment, the patient has consented to  Procedure(s): RIGHT TOTAL KNEE ARTHROPLASTY (Right) as a surgical intervention .  The patient's history has been reviewed, patient examined, no change in status, stable for surgery.  I have reviewed the patient's chart and labs.  Questions were answered to the patient's satisfaction.     Dacey Milberger JR,W D

## 2014-12-20 NOTE — Progress Notes (Signed)
Utilization review completed.  

## 2014-12-20 NOTE — Progress Notes (Signed)
Dr Renold Don here to see 12 lead and pt. He is going to talk to Dr Madelon Lips re cardiology consult for HB. OK to tx pt to 5n with telemetry/central monitor.

## 2014-12-20 NOTE — Evaluation (Signed)
Physical Therapy Evaluation Patient Details Name: Timothy Odonnell MRN: [id number] DOB: 09-20-1955 Today's Date: 12/20/2014   History of Present Illness  Rt TKA  Clinical Impression  Pt is s/p TKA resulting in the deficits listed below (see PT Problem List).  Pt will benefit from skilled PT to increase their independence and safety with mobility to allow discharge to home.      Follow Up Recommendations Home health PT    Equipment Recommendations  Rolling walker with 5" wheels    Recommendations for Other Services       Precautions / Restrictions Precautions Precautions: Knee;Fall Required Braces or Orthoses: Knee Immobilizer - Right Knee Immobilizer - Right: On when out of bed or walking Restrictions Weight Bearing Restrictions: Yes RLE Weight Bearing: Weight bearing as tolerated      Mobility  Bed Mobility Overal bed mobility: Needs Assistance Bed Mobility: Supine to Sit     Supine to sit: Min assist        Transfers Overall transfer level: Needs assistance Equipment used: Rolling walker (2 wheeled) Transfers: Sit to/from Stand Sit to Stand: Mod assist            Ambulation/Gait Ambulation/Gait assistance: Min assist Ambulation Distance (Feet): 4 Feet Assistive device: Rolling walker (2 wheeled) Gait Pattern/deviations: Step-to pattern;Decreased step length - right;Decreased stance time - right;Decreased weight shift to right   Gait velocity interpretation: Below normal speed for age/gender    Stairs            Wheelchair Mobility    Modified Rankin (Stroke Patients Only)       Balance Overall balance assessment: Needs assistance Sitting-balance support: No upper extremity supported Sitting balance-Leahy Scale: Fair     Standing balance support: Bilateral upper extremity supported Standing balance-Leahy Scale: Poor                               Pertinent Vitals/Pain Pain Assessment: 0-10 Pain Score: 0-No pain     Home Living Family/patient expects to be discharged to:: Private residence Living Arrangements: Alone Available Help at Discharge: Friend(s) Type of Home: House Home Access: Ramped entrance     Home Layout: One level Home Equipment: Crutches      Prior Function Level of Independence: Independent with assistive device(s) (crutch)               Hand Dominance        Extremity/Trunk Assessment               Lower Extremity Assessment: Overall WFL for tasks assessed;RLE deficits/detail RLE Deficits / Details: able to perform SLR with LAG       Communication   Communication: No difficulties  Cognition Arousal/Alertness: Awake/alert Behavior During Therapy: WFL for tasks assessed/performed Overall Cognitive Status: Within Functional Limits for tasks assessed                      General Comments      Exercises        Assessment/Plan    PT Assessment Patient needs continued PT services  PT Diagnosis Difficulty walking;Generalized weakness;Acute pain   PT Problem List Decreased strength;Decreased range of motion;Decreased activity tolerance;Decreased balance;Decreased mobility;Decreased knowledge of use of DME  PT Treatment Interventions DME instruction;Gait training;Stair training;Functional mobility training;Therapeutic activities;Therapeutic exercise;Patient/family education   PT Goals (Current goals can be found in the Care Plan section) Acute Rehab PT Goals Patient Stated Goal: go home  PT Goal Formulation: With patient Time For Goal Achievement: 01/03/15 Potential to Achieve Goals: Good    Frequency 7X/week   Barriers to discharge        Co-evaluation               End of Session Equipment Utilized During Treatment: Gait belt;Right knee immobilizer Activity Tolerance: Patient tolerated treatment well Patient left: in chair;with call bell/phone within reach Nurse Communication: Mobility status         Time:  2130-8657 PT Time Calculation (min) (ACUTE ONLY): 30 min   Charges:   PT Evaluation $Initial PT Evaluation Tier I: 1 Procedure PT Treatments $Therapeutic Activity: 8-22 mins   PT G Codes:        Christiane Ha, PT, CSCS Pager [phone] Office [phone]  12/20/2014, 3:58 PM

## 2014-12-20 NOTE — Progress Notes (Signed)
Orthopedic Tech Progress Note Patient Details:  Timothy Odonnell 26-Oct-1955 [id number]  CPM Right Knee CPM Right Knee: On Right Knee Flexion (Degrees): 90 Right Knee Extension (Degrees): 0 Additional Comments: trapeze bar patient helper Viewed order from doctor's order list  Nikki Dom 12/20/2014, 10:57 AM

## 2014-12-20 NOTE — H&P (View-Only) (Signed)
TOTAL KNEE ADMISSION H&P  Patient is being admitted for right total knee arthroplasty.  Subjective:  Chief Complaint:right knee pain.  HPI: Timothy Odonnell, 59 y.o. male, has a history of pain and functional disability in the right knee due to arthritis and has failed non-surgical conservative treatments for greater than 12 weeks to includeNSAID's and/or analgesics, corticosteriod injections, use of assistive devices and activity modification.  Onset of symptoms was gradual, starting 5 years ago with gradually worsening course since that time. The patient noted no past surgery on the right knee(s).  Patient currently rates pain in the right knee(s) at 8 out of 10 with activity. Patient has night pain, worsening of pain with activity and weight bearing, pain that interferes with activities of daily living, pain with passive range of motion, crepitus and joint swelling.  Patient has evidence of periarticular osteophytes and joint space narrowing by imaging studies. There is no active infection.  Patient Active Problem List   Diagnosis Date Noted  . BARRETTS ESOPHAGUS 02/02/2010  . ALCOHOL ABUSE 01/22/2010  . ALCOHOLIC HEPATITIS 01/06/2010  . LOSS OF WEIGHT 11/25/2009  . FATTY LIVER DISEASE 10/27/2009  . VITAMIN D DEFICIENCY 09/29/2009  . ELECTROCARDIOGRAM, ABNORMAL 09/26/2009  . LIVER FUNCTION TESTS, ABNORMAL, HX OF 09/19/2009  . PSYCHIATRIC DISORDER 08/26/2009  . FATIGUE 10/23/2008  . HYPERLIPIDEMIA 11/24/2006  . HYPERTENSION 11/24/2006  . GERD 11/24/2006  . OSTEOARTHRITIS 11/24/2006  . RETARDATION, MENTAL, MILD 11/07/2006   No past medical history on file.  No past surgical history on file.   (Not in a hospital admission) Allergies  Allergen Reactions  . Sulfamethoxazole-Trimethoprim Other (See Comments)    Unknown     History  Substance Use Topics  . Smoking status: Never Smoker   . Smokeless tobacco: Not on file  . Alcohol Use: Yes    Family History  Problem Relation Age  of Onset  . Diabetes Mother   . Emphysema Father      Review of Systems  Constitutional: Negative.   HENT: Negative.   Eyes: Negative.   Respiratory: Negative.   Cardiovascular: Positive for leg swelling. Negative for chest pain, palpitations, orthopnea, claudication and PND.  Gastrointestinal: Negative.   Genitourinary: Negative.   Musculoskeletal: Positive for joint pain. Negative for falls.  Skin: Negative.   Neurological: Negative.   Endo/Heme/Allergies: Negative.   Psychiatric/Behavioral: Negative.     Objective:  Physical Exam  Constitutional: He is oriented to person, place, and time. He appears well-developed and well-nourished. No distress.  HENT:  Head: Normocephalic and atraumatic.  Nose: Nose normal.  Eyes: Conjunctivae and EOM are normal. Pupils are equal, round, and reactive to light.  Neck: Normal range of motion. Neck supple.  Cardiovascular: Normal rate, regular rhythm, normal heart sounds and intact distal pulses.   Respiratory: Effort normal and breath sounds normal. No respiratory distress. He has no wheezes.  GI: Soft. Bowel sounds are normal. He exhibits no distension. There is no tenderness.  Musculoskeletal:       Right knee: He exhibits swelling and effusion. He exhibits no erythema, no LCL laxity and no MCL laxity. Tenderness found.  Lymphadenopathy:    He has no cervical adenopathy.  Neurological: He is alert and oriented to person, place, and time. No cranial nerve deficit.  Skin: Skin is warm and dry. No rash noted. No erythema.  Psychiatric: He has a normal mood and affect. His behavior is normal.    Vital signs in last 24 hours: @VSRANGES @  Labs:  Estimated body mass index is 27.59 kg/(m^2) as calculated from the following:   Height as of 12/01/13:  (1.88 m).   Weight as of 12/01/13: 97.523 kg (215 lb).   Imaging Review Plain radiographs demonstrate severe degenerative joint disease of the right knee(s). The overall alignment  issignificant valgus. The bone quality appears to be good for age and reported activity level.  Assessment/Plan:  End stage arthritis, right knee   The patient history, physical examination, clinical judgment of the provider and imaging studies are consistent with end stage degenerative joint disease of the right knee(s) and total knee arthroplasty is deemed medically necessary. The treatment options including medical management, injection therapy arthroscopy and arthroplasty were discussed at length. The risks and benefits of total knee arthroplasty were presented and reviewed. The risks due to aseptic loosening, infection, stiffness, patella tracking problems, thromboembolic complications and other imponderables were discussed. The patient acknowledged the explanation, agreed to proceed with the plan and consent was signed. Patient is being admitted for inpatient treatment for surgery, pain control, PT, OT, prophylactic antibiotics, VTE prophylaxis, progressive ambulation and ADL's and discharge planning. The patient is planning to be discharged home with home health services

## 2014-12-20 NOTE — Progress Notes (Signed)
Patient ID: Timothy Odonnell, male   DOB: [birth year], 59 y.o.   MRN: [id number] Spoke with cardiology regarding pacu findings on rhythm strip, they reviewed ecg showing type I 2nd degree AV block, recommending overnight tele monitoring with cardiology followup after discharge.

## 2014-12-20 NOTE — Transfer of Care (Signed)
Immediate Anesthesia Transfer of Care Note  Patient: Timothy Odonnell  Procedure(s) Performed: Procedure(s): RIGHT TOTAL KNEE ARTHROPLASTY (Right)  Patient Location: PACU  Anesthesia Type:Spinal and MAC combined with regional for post-op pain  Level of Consciousness: awake and alert   Airway & Oxygen Therapy: Patient Spontanous Breathing  Post-op Assessment: Report given to RN and Post -op Vital signs reviewed and stable  Post vital signs: Reviewed and stable  Last Vitals:  Filed Vitals:   12/20/14 0603  BP: 132/79  Pulse: 64  Temp: 36.4 C  Resp: 16    Complications: No apparent anesthesia complications

## 2014-12-20 NOTE — Anesthesia Postprocedure Evaluation (Signed)
Anesthesia Post Note  Patient: Timothy Odonnell  Procedure(s) Performed: Procedure(s) (LRB): RIGHT TOTAL KNEE ARTHROPLASTY (Right)  Anesthesia type: Spinal + FNB  Patient location: PACU  Post pain: Pain level controlled  Post assessment: Post-op Vital signs reviewed  Last Vitals: BP 131/84 mmHg  Pulse 56  Temp(Src) 36.7 C (Oral)  Resp 16  Ht  (1.88 m)  Wt 208 lb 15.9 oz (94.8 kg)  BMI 26.82 kg/m2  SpO2 100%  Post vital signs: Reviewed  Level of consciousness: sedated  Complications: No apparent anesthesia complications

## 2014-12-20 NOTE — Progress Notes (Signed)
Pt  In SB 45-55, BBB, ? 2nd degree [place] rhythm. Pt awake, alert, no c/o. Dr Renold Don updated. Order for 12 lead & he will be over to see pt.

## 2014-12-20 NOTE — Brief Op Note (Signed)
12/20/2014  10:19 AM  PATIENT:  [name]  59 y.o. male  PRE-OPERATIVE DIAGNOSIS:  OSTEOARTHRITIS RIGHT KNEE  POST-OPERATIVE DIAGNOSIS:  OSTEOARTHRITIS RIGHT KNEE  PROCEDURE:  Procedure(s): RIGHT TOTAL KNEE ARTHROPLASTY (Right)  SURGEON:  Surgeon(s) and Role:    * Frederico Hamman, MD - Primary  PHYSICIAN ASSISTANT: Margart Sickles, PA-C  ASSISTANTS:   ANESTHESIA:   regional, spinal and IV sedation  EBL:  Total I/O In: 1200 [I.V.:1200] Out: -   BLOOD ADMINISTERED:none  DRAINS: 1 hemovac drain lateral right knee self suction   LOCAL MEDICATIONS USED:  NONE  SPECIMEN:  No Specimen  DISPOSITION OF SPECIMEN:  N/A  COUNTS:  YES  TOURNIQUET:   Total Tourniquet Time Documented: Thigh (laterality) - 81 minutes Total: Thigh (laterality) - 81 minutes   DICTATION: .Other Dictation: Dictation Number unknown  PLAN OF CARE: Admit to inpatient   PATIENT DISPOSITION:  PACU - hemodynamically stable.   Delay start of Pharmacological VTE agent (>24hrs) due to surgical blood loss or risk of bleeding: yes

## 2014-12-21 LAB — CBC
HCT: 32.1 % — ABNORMAL LOW (ref 39.0–52.0)
Hemoglobin: 10.8 g/dL — ABNORMAL LOW (ref 13.0–17.0)
MCH: 29.2 pg (ref 26.0–34.0)
MCHC: 33.6 g/dL (ref 30.0–36.0)
MCV: 86.8 fL (ref 78.0–100.0)
Platelets: 180 10*3/uL (ref 150–400)
RBC: 3.7 MIL/uL — ABNORMAL LOW (ref 4.22–5.81)
RDW: 15.3 % (ref 11.5–15.5)
WBC: 6.8 10*3/uL (ref 4.0–10.5)

## 2014-12-21 LAB — BASIC METABOLIC PANEL
Anion gap: 5 (ref 5–15)
BUN: 8 mg/dL (ref 6–20)
CHLORIDE: 105 mmol/L (ref 101–111)
CO2: 24 mmol/L (ref 22–32)
CREATININE: 0.95 mg/dL (ref 0.61–1.24)
Calcium: 8.3 mg/dL — ABNORMAL LOW (ref 8.9–10.3)
GFR calc non Af Amer: >60 mL/min (ref >60)
GLUCOSE: 117 mg/dL — AB (ref 65–99)
Potassium: 3.7 mmol/L (ref 3.5–5.1)
Sodium: 134 mmol/L — ABNORMAL LOW (ref 135–145)

## 2014-12-21 NOTE — Progress Notes (Signed)
Patient ID: Timothy Odonnell, male   DOB: 12/15/1955, 59 y.o.   MRN: [id number]     Subjective:  Patient reports pain as mild.  Patient in the bed using the CPM  Objective:   VITALS:   Filed Vitals:   12/20/14 1321 12/20/14 1345 12/20/14 2033 12/21/14 0400  BP: 118/78 131/84 145/88 114/79  Pulse:  56 71 71  Temp:  98 F (36.7 C) 98.3 F (36.8 C) 98.1 F (36.7 C)  TempSrc:  Oral Oral Oral  Resp:  Height:   (1.88 m)    Weight:  94.8 kg (208 lb 15.9 oz)    SpO2:  100% 100% 100%    ABD soft Sensation intact distally Dorsiflexion/Plantar flexion intact Incision: dressing C/D/I and no drainage  Hemo vac drain in place.   Lab Results  Component Value Date   WBC 6.8 12/21/2014   HGB 10.8* 12/21/2014   HCT 32.1* 12/21/2014   MCV 86.8 12/21/2014   PLT 180 12/21/2014     Assessment/Plan: 1 Day Post-Op   Active Problems:   Primary localized osteoarthritis of right knee   Advance diet Up with therapy  WBAT Dry dressing PRN DC hemo vac drain  DC home   Haskel Khan 12/21/2014, 7:40 AM   Margarita Rana MD [phone]

## 2014-12-21 NOTE — Progress Notes (Signed)
RN called pharmacy to complete discharge orders. Due to none of the orders being reconciled, and including narcotics, pharmacy had RN call the MD. MD responded and wanted pharmacy to complete. I then contacted the MD and explained that it was outside our scope of practice to complete discharge orders especially with narcotics. He explained that the discharge orders were done and the patient has all prescriptions needed for discharge. MD verbalized approval to discontinue all other medications.   Aaleah Hirsch MTawanna Cooler, Pharm.D Clinical Pharmacist Pager: [phone] 12/21/2014 .3:47 PM

## 2014-12-21 NOTE — Progress Notes (Signed)
Physical Therapy Treatment Patient Details Name: Timothy Odonnell MRN: [id number] DOB: 03/08/1956 Today's Date: 12/21/2014    History of Present Illness Rt TKA    PT Comments    Patient is making good progress with PT.  From a mobility standpoint anticipate patient will be ready for DC home with assistance.     Follow Up Recommendations  Home health PT     Equipment Recommendations  Rolling walker with 5" wheels    Recommendations for Other Services       Precautions / Restrictions Precautions Precautions: Knee;Fall Precaution Booklet Issued: Yes (comment) Precaution Comments: HEP Required Braces or Orthoses: Knee Immobilizer - Right Knee Immobilizer - Right: On when out of bed or walking Restrictions Weight Bearing Restrictions: Yes RLE Weight Bearing: Weight bearing as tolerated    Mobility  Bed Mobility Overal bed mobility: Needs Assistance Bed Mobility: Supine to Sit     Supine to sit: Supervision        Transfers Overall transfer level: Needs assistance Equipment used: Rolling walker (2 wheeled) Transfers: Sit to/from Stand Sit to Stand: Supervision            Ambulation/Gait Ambulation/Gait assistance: Min guard Ambulation Distance (Feet): 115 Feet Assistive device: Rolling walker (2 wheeled) Gait Pattern/deviations: Step-to pattern;Decreased weight shift to right;Decreased stance time - right Gait velocity: slow pattern       Stairs            Wheelchair Mobility    Modified Rankin (Stroke Patients Only)       Balance Overall balance assessment: Needs assistance Sitting-balance support: No upper extremity supported Sitting balance-Leahy Scale: Good     Standing balance support: Single extremity supported Standing balance-Leahy Scale: Poor                      Cognition Arousal/Alertness: Awake/alert Behavior During Therapy: WFL for tasks assessed/performed Overall Cognitive Status: Within Functional Limits for  tasks assessed                      Exercises Total Joint Exercises Ankle Circles/Pumps: AROM;Both;10 reps Quad Sets: Strengthening;Right;10 reps Heel Slides: AAROM;Right;10 reps Hip ABduction/ADduction: Right;10 reps;Supine;Strengthening Straight Leg Raises: 10 reps;Right;Strengthening (max assist) Goniometric ROM: 71 flexion    General Comments        Pertinent Vitals/Pain Pain Assessment: 0-10 Pain Score: 8  Pain Location: Rt knee Pain Descriptors / Indicators: Aching Pain Intervention(s): Monitored during session    Home Living                      Prior Function            PT Goals (current goals can now be found in the care plan section) Acute Rehab PT Goals Patient Stated Goal: Go home PT Goal Formulation: With patient Time For Goal Achievement: 01/03/15 Potential to Achieve Goals: Good Progress towards PT goals: Progressing toward goals    Frequency  7X/week    PT Plan Current plan remains appropriate    Co-evaluation             End of Session Equipment Utilized During Treatment: Gait belt;Right knee immobilizer Activity Tolerance: Patient tolerated treatment well Patient left: in chair;with call bell/phone within reach     Time: 0826-0904 PT Time Calculation (min) (ACUTE ONLY): 38 min  Charges:  $Gait Training: 23-37 mins $Therapeutic Exercise: 8-22 mins  G Codes:      Christiane Ha, PT, CSCS Pager [phone] Office [phone]  12/21/2014, 9:11 AM

## 2014-12-21 NOTE — Progress Notes (Signed)
Physical Therapy Treatment Patient Details Name: Timothy Odonnell MRN: [id number] DOB: 1955/12/17 Today's Date: 12/21/2014    History of Present Illness Rt TKA    PT Comments    Patient is making good progress with PT.  From a mobility standpoint anticipate patient will be ready for DC home. Reviewed HEP and activity at home for D/C. Patient has copy of HEP for home. Patient denies any questions or concerns.       Follow Up Recommendations  Home health PT     Equipment Recommendations  Rolling walker with 5" wheels    Recommendations for Other Services       Precautions / Restrictions Precautions Precautions: Knee;Fall Precaution Booklet Issued: Yes (comment) Precaution Comments: HEP Required Braces or Orthoses: Knee Immobilizer - Right Knee Immobilizer - Right: On when out of bed or walking Restrictions Weight Bearing Restrictions: Yes RLE Weight Bearing: Weight bearing as tolerated    Mobility  Bed Mobility                  Transfers Overall transfer level: Needs assistance Equipment used: Rolling walker (2 wheeled) Transfers: Sit to/from Stand Sit to Stand: Supervision            Ambulation/Gait                 Stairs            Wheelchair Mobility    Modified Rankin (Stroke Patients Only)       Balance Overall balance assessment: Needs assistance Sitting-balance support: No upper extremity supported Sitting balance-Leahy Scale: Good     Standing balance support: No upper extremity supported Standing balance-Leahy Scale: Fair                      Cognition Arousal/Alertness: Awake/alert Behavior During Therapy: WFL for tasks assessed/performed Overall Cognitive Status: Within Functional Limits for tasks assessed                      Exercises Total Joint Exercises Ankle Circles/Pumps: AROM;Both;10 reps Quad Sets: Strengthening;Right;10 reps Towel Squeeze: Strengthening;Both;10 reps Short Arc Quad:  Strengthening;Right;5 reps Heel Slides: AAROM;Right;10 reps Hip ABduction/ADduction: Strengthening;Right;10 reps Straight Leg Raises: Strengthening;Right;10 reps (mod assist) Long Arc Quad: Right;Strengthening;10 reps (available range)    General Comments        Pertinent Vitals/Pain Pain Assessment: 0-10 Pain Score: 5  Pain Location: RT knee Pain Descriptors / Indicators: Aching Pain Intervention(s): Monitored during session    Home Living Family/patient expects to be discharged to:: Private residence Living Arrangements: Alone Available Help at Discharge: Friend(s);Available PRN/intermittently (alone most of the day) Type of Home: House Home Access: Ramped entrance   Home Layout: One level Home Equipment: Crutches;Tub bench      Prior Function Level of Independence: Independent with assistive device(s) (cruthc)          PT Goals (current goals can now be found in the care plan section) Acute Rehab PT Goals Patient Stated Goal: home PT Goal Formulation: With patient Time For Goal Achievement: 01/03/15 Potential to Achieve Goals: Good Progress towards PT goals: Progressing toward goals    Frequency  7X/week    PT Plan Current plan remains appropriate    Co-evaluation             End of Session   Activity Tolerance: Patient tolerated treatment well Patient left: in chair;with call bell/phone within reach     Time: 4098-1191 PT Time Calculation (min) (  ACUTE ONLY): 16 min  Charges:  $Therapeutic Exercise: 8-22 mins                    G Codes:      Christiane Ha, PT, CSCS Pager [phone] Office [phone]  12/21/2014, 2:07 PM

## 2014-12-21 NOTE — Op Note (Signed)
NAME:  TEANCUM, BRULE NO.:  [id number]  MEDICAL RECORD NO.:  [id number]  LOCATION:  [id number]                        FACILITY:  [facility]  PHYSICIAN:  Dyke Brackett, M.D.    DATE OF BIRTH:  1956/03/03  DATE OF PROCEDURE:  12/20/2014 DATE OF DISCHARGE:                              OPERATIVE REPORT   INDICATIONS:  A 59 year old with intractable knee pain, severe end-stage knee with valgus deformity, thought to be amenable to hospitalization.  PREOPERATIVE DIAGNOSIS:  Severe osteoarthritis, right knee, with valgus deformity.  POSTOPERATIVE DIAGNOSIS:  Severe osteoarthritis, right knee, with valgus deformity.  OPERATION:  Right total knee replacement (Sigma cemented size 4 tibia, femur 12.5 mm bearing and 38 mm all-poly patella).  SURGEON:  Dyke Brackett, M.D.  ASSISTANT:  Margart Sickles, PA.  ANESTHESIA:  Spinal anesthetic with femoral nerve block with tourniquet time approximately 78 minutes.  DESCRIPTION OF PROCEDURE:  Sterile prep and drape of right leg, exsanguination of the leg, inflation to 350.  Straight skin incision with medial parapatellar approach to the knee made.  We exposed the knee with the most diseased lateral compartment with __________ metal wear on the lateral compartment, made a 5-degree valgus cut, resecting 11 mm of distal femur, followed by resecting about 3 mm below the most diseased lateral compartment.  Extension gap measured at 12.5 mm.  Then sized the femur, initially sized to size 5, but due to the minimal cut, we were getting posteriorly down sized to a 4, then performed the cuts with the appropriate degree of external rotation, anterior, posterior, and chamfer cuts with the flexion gap equaled to extension gap at 12.5 mm. PCL was sacrificed.  Excess menisci were removed from the knee.  There was a medial large osteophyte on the knee, but __________ possibly calcification or heterotopic bone formation within the MCL and point  of fact, I would have had to take down the whole MCL to get at this fragment.  My own view is that would not have been indicated and we decided intraoperatively not to pursue that course.  Keel hole was cut for the tibia, followed by placement of a tibial trial and a femoral trial after the box cut was cut for the femur.  We obtained full extension with the preoperative flexion contracture. Valgus deformity was restored to normal axis.  We then cut leaving about 14-15 mm of native patella for 38 mm all-poly patella trial.  Again, full extension, excellent balancing of ligaments was obtained.  We did not have to perform a lateral release despite the valgus deformity, and there was a negligible drawer with the knee flexed 90 degrees.  Trial components were removed.  Final components were inserted with the cement in a doughy state, tibia followed by femur and patella.  We elected to use a trial bearing while the cement was allowed to harden. We removed the trial bearing, excess cement was removed from the posterior aspect of the knee.  Tourniquet was released.  No excessive bleeding was noted.  The patient had an extreme amount of synovitis, which required extensive synovectomy.  We elected to use a Hemovac drain exiting superolaterally.  Exposure of the knee  due to the deformity led to partial avulsion of the patellar tendon, not nearly a full avulsion, but I elected to repair this patellar tendon with a Mitek anchor, although it should be noted it was not a complete tear.  Remainder of the closure with #1 Ethibond, 0 and 2-0 Vicryl, skin with a stapling device.  Lightly compressive sterile dressing and the knee immobilizer applied, taken to recovery room in stable condition.     Dyke Brackett, M.D.     WDC/MEDQ  D:  12/20/2014  T:  12/20/2014  Job:  [id number]

## 2014-12-21 NOTE — Care Management Note (Signed)
Case Management Note  Patient Details  Name: OTHER ATIENZA MRN: [id number] Date of Birth: 21-Mar-1956  Subjective/Objective:    59 yr old male s/p right total knee arthroplasty.                Action/Plan:   Case manager spoke with patient concerning home health and DME needs. Patient was preoperatively setup with [facility], no changes. Patient states he has support at discharge.  Expected Discharge Date:     12/21/14             Expected Discharge Plan:   Home with Home Health  In-House Referral:     Discharge planning Services   Home Health  Post Acute Care Choice:    Choice offered to:     DME Arranged:   RW 3in1, CPM DME Agency:   [employer]  HH Arranged:   PT HH Agency:   [facility]  Status of Service:     Medicare Important Message Given:    Date Medicare IM Given:    Medicare IM give by:    Date Additional Medicare IM Given:    Additional Medicare Important Message give by:     If discussed at Long Length of Stay Meetings, dates discussed:    Additional Comments:  Durenda Guthrie, RN 12/21/2014, 12:02 PM

## 2014-12-21 NOTE — Progress Notes (Signed)
Notified by CCMD that pt had a 1.52 second pause. Pt asymptomatic. Complains only of pain at this time.  Nursing will continue to monitor.

## 2014-12-21 NOTE — Discharge Instructions (Signed)
INSTRUCTIONS AFTER JOINT REPLACEMENT  ° °o Remove items at home which could result in a fall. This includes throw rugs or furniture in walking pathways °o ICE to the affected joint every three hours while awake for 30 minutes at a time, for at least the first 3-5 days, and then as needed for pain and swelling.  Continue to use ice for pain and swelling. You may notice swelling that will progress down to the foot and ankle.  This is normal after surgery.  Elevate your leg when you are not up walking on it.   °o Continue to use the breathing machine you got in the hospital (incentive spirometer) which will help keep your temperature down.  It is common for your temperature to cycle up and down following surgery, especially at night when you are not up moving around and exerting yourself.  The breathing machine keeps your lungs expanded and your temperature down. ° ° °DIET:  As you were doing prior to hospitalization, we recommend a well-balanced diet. ° °DRESSING / WOUND CARE / SHOWERING ° °You may change your dressing 3-5 days after surgery.  Then change the dressing every day with sterile gauze.  Please use good hand washing techniques before changing the dressing.  Do not use any lotions or creams on the incision until instructed by your surgeon. ° °ACTIVITY ° °o Increase activity slowly as tolerated, but follow the weight bearing instructions below.   °o No driving for 6 weeks or until further direction given by your physician.  You cannot drive while taking narcotics.  °o No lifting or carrying greater than 10 lbs. until further directed by your surgeon. °o Avoid periods of inactivity such as sitting longer than an hour when not asleep. This helps prevent blood clots.  °o You may return to work once you are authorized by your doctor.  ° ° ° °WEIGHT BEARING  ° °Weight bearing as tolerated with assist device (walker, cane, etc) as directed, use it as long as suggested by your surgeon or therapist, typically at  least 4-6 weeks. ° ° °EXERCISES ° °Results after joint replacement surgery are often greatly improved when you follow the exercise, range of motion and muscle strengthening exercises prescribed by your doctor. Safety measures are also important to protect the joint from further injury. Any time any of these exercises cause you to have increased pain or swelling, decrease what you are doing until you are comfortable again and then slowly increase them. If you have problems or questions, call your caregiver or physical therapist for advice.  ° °Rehabilitation is important following a joint replacement. After just a few days of immobilization, the muscles of the leg can become weakened and shrink (atrophy).  These exercises are designed to build up the tone and strength of the thigh and leg muscles and to improve motion. Often times heat used for twenty to thirty minutes before working out will loosen up your tissues and help with improving the range of motion but do not use heat for the first two weeks following surgery (sometimes heat can increase post-operative swelling).  ° °These exercises can be done on a training (exercise) mat, on the floor, on a table or on a bed. Use whatever works the best and is most comfortable for you.    Use music or television while you are exercising so that the exercises are a pleasant break in your day. This will make your life better with the exercises acting as a break   in your routine that you can look forward to.   Perform all exercises about fifteen times, three times per day or as directed.  You should exercise both the operative leg and the other leg as well. ° °Exercises include: °  °• Quad Sets - Tighten up the muscle on the front of the thigh (Quad) and hold for 5-10 seconds.   °• Straight Leg Raises - With your knee straight (if you were given a brace, keep it on), lift the leg to 60 degrees, hold for 3 seconds, and slowly lower the leg.  Perform this exercise against  resistance later as your leg gets stronger.  °• Leg Slides: Lying on your back, slowly slide your foot toward your buttocks, bending your knee up off the floor (only go as far as is comfortable). Then slowly slide your foot back down until your leg is flat on the floor again.  °• Angel Wings: Lying on your back spread your legs to the side as far apart as you can without causing discomfort.  °• Hamstring Strength:  Lying on your back, push your heel against the floor with your leg straight by tightening up the muscles of your buttocks.  Repeat, but this time bend your knee to a comfortable angle, and push your heel against the floor.  You may put a pillow under the heel to make it more comfortable if necessary.  ° °A rehabilitation program following joint replacement surgery can speed recovery and prevent re-injury in the future due to weakened muscles. Contact your doctor or a physical therapist for more information on knee rehabilitation.  ° ° °CONSTIPATION ° °Constipation is defined medically as fewer than three stools per week and severe constipation as less than one stool per week.  Even if you have a regular bowel pattern at home, your normal regimen is likely to be disrupted due to multiple reasons following surgery.  Combination of anesthesia, postoperative narcotics, change in appetite and fluid intake all can affect your bowels.  ° °YOU MUST use at least one of the following options; they are listed in order of increasing strength to get the job done.  They are all available over the counter, and you may need to use some, POSSIBLY even all of these options:   ° °Drink plenty of fluids (prune juice may be helpful) and high fiber foods °Colace 100 mg by mouth twice a day  °Senokot for constipation as directed and as needed Dulcolax (bisacodyl), take with full glass of water  °Miralax (polyethylene glycol) once or twice a day as needed. ° °If you have tried all these things and are unable to have a bowel  movement in the first 3-4 days after surgery call either your surgeon or your primary doctor.   ° °If you experience loose stools or diarrhea, hold the medications until you stool forms back up.  If your symptoms do not get better within 1 week or if they get worse, check with your doctor.  If you experience "the worst abdominal pain ever" or develop nausea or vomiting, please contact the office immediately for further recommendations for treatment. ° ° °ITCHING:  If you experience itching with your medications, try taking only a single pain pill, or even half a pain pill at a time.  You can also use Benadryl over the counter for itching or also to help with sleep.  ° °TED HOSE STOCKINGS:  Use stockings on both legs until for at least 2 weeks or as   directed by physician office. They may be removed at night for sleeping. ° °MEDICATIONS:  See your medication summary on the “After Visit Summary” that nursing will review with you.  You may have some home medications which will be placed on hold until you complete the course of blood thinner medication.  It is important for you to complete the blood thinner medication as prescribed. ° °PRECAUTIONS:  If you experience chest pain or shortness of breath - call 911 immediately for transfer to the hospital emergency department.  ° °If you develop a fever greater that 101 F, purulent drainage from wound, increased redness or drainage from wound, foul odor from the wound/dressing, or calf pain - CONTACT YOUR SURGEON.   °                                                °FOLLOW-UP APPOINTMENTS:  If you do not already have a post-op appointment, please call the office for an appointment to be seen by your surgeon.  Guidelines for how soon to be seen are listed in your “After Visit Summary”, but are typically between 1-4 weeks after surgery. ° °OTHER INSTRUCTIONS:  ° °Knee Replacement:  Do not place pillow under knee, focus on keeping the knee straight while resting. CPM  instructions: 0-90 degrees, 2 hours in the morning, 2 hours in the afternoon, and 2 hours in the evening. Place foam block, curve side up under heel at all times except when in CPM or when walking.  DO NOT modify, tear, cut, or change the foam block in any way. ° °MAKE SURE YOU:  °• Understand these instructions.  °• Get help right away if you are not doing well or get worse.  ° ° °Thank you for letting us be a part of your medical care team.  It is a privilege we respect greatly.  We hope these instructions will help you stay on track for a fast and full recovery!  ° ° ° °Information on my medicine - ELIQUIS® (apixaban) ° °This medication education was reviewed with me or my healthcare representative as part of my discharge preparation.  The pharmacist that spoke with me during my hospital stay was:  Ayanah Snader Dien, RPH ° °Why was Eliquis® prescribed for you? °Eliquis® was prescribed for you to reduce the risk of blood clots forming after orthopedic surgery.   ° °What do You need to know about Eliquis®? °Take your Eliquis® TWICE DAILY - one tablet in the morning and one tablet in the evening with or without food.  It would be best to take the dose about the same time each day. ° °If you have difficulty swallowing the tablet whole please discuss with your pharmacist how to take the medication safely. ° °Take Eliquis® exactly as prescribed by your doctor and DO NOT stop taking Eliquis® without talking to the doctor who prescribed the medication.  Stopping without other medication to take the place of Eliquis® may increase your risk of developing a clot. ° °After discharge, you should have regular check-up appointments with your healthcare provider that is prescribing your Eliquis®. ° °What do you do if you miss a dose? °If a dose of ELIQUIS® is not taken at the scheduled time, take it as soon as possible on the same day and twice-daily administration should be resumed.  The dose should not   be doubled to make up for  a missed dose.  Do not take more than one tablet of ELIQUIS at the same time. ° °Important Safety Information °A possible side effect of Eliquis® is bleeding. You should call your healthcare provider right away if you experience any of the following: °? Bleeding from an injury or your nose that does not stop. °? Unusual colored urine (red or dark brown) or unusual colored stools (red or black). °? Unusual bruising for unknown reasons. °? A serious fall or if you hit your head (even if there is no bleeding). ° °Some medicines may interact with Eliquis® and might increase your risk of bleeding or clotting while on Eliquis®. To help avoid this, consult your healthcare provider or pharmacist prior to using any new prescription or non-prescription medications, including herbals, vitamins, non-steroidal anti-inflammatory drugs (NSAIDs) and supplements. ° °This website has more information on Eliquis® (apixaban): http://www.eliquis.com/eliquis/home ° °

## 2014-12-21 NOTE — Evaluation (Signed)
Occupational Therapy Evaluation and Discharge Patient Details Name: Timothy Odonnell MRN: [id number] DOB: 26-Dec-1955 Today's Date: 12/21/2014    History of Present Illness Rt TKA   Clinical Impression   This 59 yo male admitted and underwent above presents to acute OT with all education completed and pt as well as primary caregiver without further questions/concerns about BADLs. I did give pt a walker bag to use to hold/carry items when he is up and about. Acute OT will sign off.    Follow Up Recommendations  No OT follow up    Equipment Recommendations  None recommended by OT       Precautions / Restrictions Precautions Precautions: Knee;Fall Precaution Booklet Issued: Yes (comment) Precaution Comments: HEP Required Braces or Orthoses: Knee Immobilizer - Right Knee Immobilizer - Right: On when out of bed or walking Restrictions Weight Bearing Restrictions: No RLE Weight Bearing: Weight bearing as tolerated              ADL                                         General ADL Comments: friend in room helping him bath seated in recliner. She says she very familiar with having to care for people after injury/illness/surgery due to helping to take care of family members once of which had a stroke and couldn't move one side very well. I did educate them on getting dressed and putting RLE in underwear/shorts first. They are aware pt cannot get RLE wet until cleared by MD to do so and then friend states she is very familar with helping him to use a tub bench.      Vision Additional Comments: No change from baseline          Pertinent Vitals/Pain Pain Assessment: 0-10 Pain Score: 4 Pain Location: Rt knee Pain Descriptors / Indicators: Aching Pain Intervention(s): Monitored during session     Hand Dominance Right   Extremity/Trunk Assessment Upper Extremity Assessment Upper Extremity Assessment: Overall WFL for tasks assessed            Communication Communication Communication: No difficulties   Cognition Arousal/Alertness: Awake/alert Behavior During Therapy: WFL for tasks assessed/performed Overall Cognitive Status: Within Functional Limits for tasks assessed                                Home Living Family/patient expects to be discharged to:: Private residence Living Arrangements: Alone Available Help at Discharge: Friend(s);Available PRN/intermittently (alone most of the day) Type of Home: House Home Access: Ramped entrance     Home Layout: One level     Bathroom Shower/Tub: Tub/shower unit;Curtain Shower/tub characteristics: Engineer, building services:  (with countertop close by he can use to steady himself for sit<>stand per his report)     Home Equipment: Crutches;Tub bench          Prior Functioning/Environment Level of Independence: Independent with assistive device(s) (cruthc)             OT Diagnosis: Generalized weakness         OT Goals(Current goals can be found in the care plan section) Acute Rehab OT Goals Patient Stated Goal: home  OT Frequency:                End of Session    Activity Tolerance: Patient  tolerated treatment well Patient left: in chair   Time: 1053-1104 OT Time Calculation (min): 11 min Charges:  OT General Charges $OT Visit: 1 Procedure OT Evaluation $Initial OT Evaluation Tier I: 1 Procedure  Evette Georges [phone] 12/21/2014, 11:29 AM

## 2014-12-23 ENCOUNTER — Encounter (HOSPITAL_COMMUNITY): Payer: Self-pay | Admitting: Orthopedic Surgery

## 2015-01-03 ENCOUNTER — Ambulatory Visit (INDEPENDENT_AMBULATORY_CARE_PROVIDER_SITE_OTHER): Payer: Medicaid Other | Admitting: Physician Assistant

## 2015-01-03 ENCOUNTER — Encounter: Payer: Self-pay | Admitting: Physician Assistant

## 2015-01-03 VITALS — BP 120/80 | HR 90 | Ht 74.0 in | Wt 198.0 lb

## 2015-01-03 DIAGNOSIS — I480 Paroxysmal atrial fibrillation: Secondary | ICD-10-CM | POA: Diagnosis not present

## 2015-01-03 DIAGNOSIS — R9431 Abnormal electrocardiogram [ECG] [EKG]: Secondary | ICD-10-CM | POA: Diagnosis not present

## 2015-01-03 DIAGNOSIS — E785 Hyperlipidemia, unspecified: Secondary | ICD-10-CM

## 2015-01-03 NOTE — Patient Instructions (Addendum)
Medication Instructions:   Your physician recommends that you continue on your current medications as directed. Please refer to the Current Medication list given to you today.    [facility]  NONE ORDER TODAY    Testing/Procedures:  NONE ORDER TODAY    Follow-Up:  WITH DONNA CAROL IN A FIB CLINIC 6 TO 8 WEEKS OR NEXT AVAILABLE   Any Other Special Instructions Will Be Listed Below (If Applicable).

## 2015-01-03 NOTE — Progress Notes (Signed)
Cardiology Office Note   Date:  01/03/2015   ID:  Godwin, Tedesco 1955-07-31, MRN [id number]  PCP:  No PCP Per Patient  Cardiologist:  Rudi Coco, NP  Theodore Demark, PA-C   Chief Complaint  Patient presents with  . Gastrophageal Reflux    History of Present Illness: Timothy Odonnell is a 59 y.o. male with a history of OA, had Afib on pre-op ECG, seen in Afib clinic prior to surgery. S/p R TKR, discharged 12/21/2014.   Per pt girlfriend, his heart stopped during the surgery and he was told to follow up with cardiology.   Upon review of telemetry strips, SR with some dropped beats, probable Mobitz II, no pauses > 2.5 seconds are recorded. ECG demonstrates PR intervals from 198 ms (07/21) >> 296 ms (07/29 11:52) >> 328 ms (07/29 at 11:54) >> 146 ms (08/12). The longer PR intervals were in the post-operative period.  Timothy Odonnell presents for post-hospital f/u of atrial fibrillation and bradycardia. Mr Vanbenschoten never gets palpitations. He does not ever feel his heart skip or race. He has no history of presyncope or syncope. He never gets light-headed. He has no chest pain. He never has DOE, PND or orthopnea.   His knee is getting better. He had some LE edema after the surgery, but that has improved. He is increasing his activity successfully and feels he is recovering well.    Past Medical History  Diagnosis Date  . Arthritis   . Essential hypertension   . Hyperlipidemia   . GERD (gastroesophageal reflux disease)   . Mental retardation     Past Surgical History  Procedure Laterality Date  . Total knee arthroplasty Right 12/20/2014    Procedure: RIGHT TOTAL KNEE ARTHROPLASTY;  Surgeon: Frederico Hamman, MD;  Location: [facility] OR;  Service: Orthopedics;  Laterality: Right;    Current Outpatient Prescriptions  Medication Sig Dispense Refill  . oxyCODONE-acetaminophen (PERCOCET) 5-325 MG per tablet Take by mouth every 4 (four) hours as needed for severe pain.     No  current facility-administered medications for this visit.    Allergies:   Sulfamethoxazole-trimethoprim    Social History:  The patient  reports that he has been smoking.  He does not have any smokeless tobacco history on file. He reports that he drinks alcohol. He reports that he uses illicit drugs (Benzodiazepines).   Family History:  The patient's family history includes Diabetes in his mother; Emphysema in his father; Hypertension in his brother and mother. There is no history of Stroke.    ROS:  Please see the history of present illness. All other systems are reviewed and negative.    PHYSICAL EXAM: VS:  BP 120/80 mmHg  Pulse 90  Ht  (1.88 m)  Wt 198 lb (89.812 kg)  BMI 25.41 kg/m2 , BMI Body mass index is 25.41 kg/(m^2). GEN: Well nourished, well developed, in no acute distress HEENT: normal, significant underbite. Neck: no JVD, carotid bruits, or masses Cardiac: RRR; no murmurs, rubs, or gallops,no edema  Respiratory:  clear to auscultation bilaterally, normal work of breathing GI: soft, nontender, nondistended, + BS MS: no deformity or atrophy. L knee incision healing well, trace LE edema Skin: warm and dry, no rash Neuro:  Strength and sensation are intact Psych: euthymic mood, full affect   EKG:  EKG is ordered today. The ekg ordered today demonstrates SR, no acute ischemic changes. PR improved.   Recent Labs: 12/09/2014: ALT 15* 12/21/2014: BUN 8;  Creatinine, Ser 0.95; Hemoglobin 10.8*; Platelets 180; Potassium 3.7; Sodium 134*    Lipid Panel    Component Value Date/Time   CHOL 216* 11/25/2009 2157   TRIG 814* 11/25/2009 2157   HDL 31* 11/25/2009 2157   CHOLHDL 7.0 Ratio 11/25/2009 2157   VLDL NOT CALC mg/dL 16/02/9603 5409   LDLCALC See Comment mg/dL 81/19/1478 2956     Wt Readings from Last 3 Encounters:  01/03/15 198 lb (89.812 kg)  12/20/14 208 lb 15.9 oz (94.8 kg)  12/12/14 206 lb 6.4 oz (93.622 kg)     Other studies Reviewed: Additional  studies/ records that were reviewed today include: hospital records, ECGs.  ASSESSMENT AND PLAN:  1.  Atrial fibrillation: currently SR, any atrial fibrillation is asymptomatic. Because of the intermittent 1st and 2nd heart block and normal BP, will not add BB at this time.   2. Anticoagulation: His girlfriend is helping with compliance. He took most of the Eliquis as prescribed, but then stopped it. This patients CHA2DS2-VASc Score and unadjusted Ischemic Stroke Rate (% per year) is equal to 0.6 % stroke rate/year from a score of 1 Above score calculated as 1 point each if present [CHF, HTN, DM, Vascular=MI/PAD/Aortic Plaque, Age if 65-74, or Male], 2 points each if present [Age > 75, or Stroke/TIA/TE].   His ETOH use can be high and compliance may be an issue. Therefore, will not anticoagulate at this time for a score of 1.   3. ETOH use: he has not had a drink in 3 weeks. However, he normally drinks 2-3 tallboys (40 oz) per night and has weeks where he binges, drinking very heavily. Encouraged ETOH cessation, but this is also a concern if anticoagulation is indicated.  Current medicines are reviewed at length with the patient today.  The patient does not have concerns regarding medicines.  The following changes have been made:  no change  Labs/ tests ordered today include:   Orders Placed This Encounter  Procedures  . EKG 12-Lead     Disposition:   FU with Rudi Coco, NP.  Tawny Asal  01/03/2015 6:03 PM    [facility] [address] [place], [place], [place]  [address] Phone: [phone]; Fax: [phone]

## 2015-01-07 NOTE — Discharge Summary (Signed)
PATIENT ID: AARAV BURGETT        MRN:  [id number]          DOB/AGE: 21-Nov-1955 / 59 y.o.    DISCHARGE SUMMARY  ADMISSION DATE:    12/20/2014 DISCHARGE DATE:   12/21/14  ADMISSION DIAGNOSIS: OA RIGHT KNEE    DISCHARGE DIAGNOSIS:  OSTEOARTHRITIS RIGHT KNEE    ADDITIONAL DIAGNOSIS: Active Problems:   Primary localized osteoarthritis of right knee  Past Medical History  Diagnosis Date  . Arthritis   . Essential hypertension   . Hyperlipidemia   . GERD (gastroesophageal reflux disease)   . Mental retardation     PROCEDURE: Procedure(s): RIGHT TOTAL KNEE ARTHROPLASTY Right on 12/20/2014  CONSULTS: PT/OT      HISTORY:  See H&P in chart  HOSPITAL COURSE:  AUDREY ELLER is a 59 y.o. admitted on 12/20/2014 and found to have a diagnosis of OSTEOARTHRITIS RIGHT KNEE.  After appropriate laboratory studies were obtained  they were taken to the operating room on 12/20/2014 and underwent  Procedure(s): RIGHT TOTAL KNEE ARTHROPLASTY  Right.   They were given perioperative antibiotics:  Anti-infectives    Start     Dose/Rate Route Frequency Ordered Stop   12/20/14 1345  ceFAZolin (ANCEF) IVPB 1 g/50 mL premix     1 g 100 mL/hr over 30 Minutes Intravenous Every 6 hours 12/20/14 1340 12/20/14 2142   12/20/14 0600  ceFAZolin (ANCEF) IVPB 2 g/50 mL premix     2 g 100 mL/hr over 30 Minutes Intravenous To ShortStay Surgical 12/19/14 1048 12/20/14 0752    .  Tolerated the procedure well.  Had abnormal rhythm strip in PACU, had cardiology review strip appearing to be Mobitz II BBB, recommend outpatient followup.    POD #1, allowed out of bed to a chair.  PT for ambulation and exercise program.  IV saline locked.  O2 discontionued. Hemovac pulled.  The remainder of the hospital course was dedicated to ambulation and strengthening.   The patient was discharged on post op day #1 in  Stable condition.  Blood products given:none  DIAGNOSTIC STUDIES: Recent vital signs: No data found.    Recent laboratory studies: No results for input(s): WBC, HGB, HCT, PLT in the last 168 hours. No results for input(s): NA, K, CL, CO2, BUN, CREATININE, GLUCOSE, CALCIUM in the last 168 hours. Lab Results  Component Value Date   INR 1.06 12/09/2014     Recent Radiographic Studies :  Dg Chest 2 View  12/09/2014   CLINICAL DATA:  Patient is scheduled for knee surgery on July 29th, no history of cardiopulmonary abnormality, current smoker  EXAM: CHEST  2 VIEW  COMPARISON:  CT scan of the chest of January 26, 2006  FINDINGS: The lungs are mildly hyperinflated. The interstitial markings are coarse bilaterally. There is no alveolar infiltrate, parenchymal nodules, or pleural effusion. There are old posterior rib fractures on the right. The heart and pulmonary vascularity are normal. The trachea is midline. There is multilevel degenerative disc space narrowing of the thoracic spine. There is calcification of portions of the anterior longitudinal ligament.  IMPRESSION: COPD with mild pulmonary fibrotic change. There is no acute cardiopulmonary abnormality.   Electronically Signed   By: David  [place] M.D.   On: 12/09/2014 11:17    DISCHARGE INSTRUCTIONS:   DISCHARGE MEDICATIONS:     Medication List    STOP taking these medications        diclofenac 75 MG EC tablet  Commonly  known as:  VOLTAREN     HYDROcodone-acetaminophen 5-325 MG per tablet  Commonly known as:  NORCO/VICODIN        FOLLOW UP VISIT:       Follow-up Information    Follow up with CAFFREY JR,W D, MD. Schedule an appointment as soon as possible for a visit in 2 weeks.   Specialty:  Orthopedic Surgery   Contact information:   [address] [place] [address] [phone]       Follow up with [facility]. Schedule an appointment as soon as possible for a visit in 2 weeks.   Why:  regarding new ecg findings   Contact information:   [address] [place] [address] [phone]      Follow up with [facility].   Why:  Someone from [facility] will contact you concerning start date and time for therapy.   Contact information:   [address] [place] [address] [phone]       DISPOSITION:   Home  CONDITION:  Stable  Margart Sickles, PA-C

## 2015-01-08 MED ORDER — BUPIVACAINE-EPINEPHRINE (PF) 0.5% -1:200000 IJ SOLN
INTRAMUSCULAR | Status: DC | PRN
  Administered 2014-12-20: 20 mL via PERINEURAL

## 2015-01-08 NOTE — Addendum Note (Signed)
Addendum  created 01/08/15 1147 by Lewie Loron, MD   Modules edited: Anesthesia Blocks and Procedures, Anesthesia Medication Administration, Clinical Notes   Clinical Notes:  File: [id number]

## 2015-01-08 NOTE — Anesthesia Procedure Notes (Signed)
Anesthesia Regional Block:  Femoral nerve block  Pre-Anesthetic Checklist: ,, timeout performed, Correct Patient, Correct Site, Correct Laterality, Correct Procedure, Correct Position, site marked, Risks and benefits discussed, Surgical consent,  Pre-op evaluation,  Post-op pain management  Laterality: Right  Prep: chloraprep       Needles:  Injection technique: Single-shot  Needle Type: Stimulator Needle - 80     Needle Length: 9cm 9 cm Needle Gauge: 22 and 22 G  Needle insertion depth: 6 cm   Additional Needles:  Procedures: ultrasound guided (picture in chart) Femoral nerve block  Nerve Stimulator or Paresthesia:  Response: 0.5 mA,   Additional Responses:   Narrative:  Injection made incrementally with aspirations every 5 mL.  Performed by: Personally  Anesthesiologist: Lewie Loron  Additional Notes: BP cuff, EKG monitors applied. Sedation begun. Femoral artery palpated for location of nerve. After nerve location verified with U/S, anesthetic injected incrementally, slowly, and after negative aspirations under direct u/s guidance. Good perineural spread. Patient tolerated well.

## 2015-01-08 NOTE — Addendum Note (Signed)
Addendum  created 01/08/15 1057 by Lewie Loron, MD   Modules edited: Clinical Notes   Clinical Notes:  File: [id number]

## 2015-02-14 ENCOUNTER — Ambulatory Visit (HOSPITAL_COMMUNITY)
Admission: RE | Admit: 2015-02-14 | Discharge: 2015-02-14 | Disposition: A | Discharge status: Home / Self Care | Payer: Medicaid Other | Source: Ambulatory Visit | Attending: Nurse Practitioner | Admitting: Nurse Practitioner

## 2015-02-14 VITALS — BP 130/84 | HR 79 | Ht 74.0 in | Wt 207.0 lb

## 2015-02-14 DIAGNOSIS — F1010 Alcohol abuse, uncomplicated: Secondary | ICD-10-CM | POA: Insufficient documentation

## 2015-02-14 DIAGNOSIS — I480 Paroxysmal atrial fibrillation: Secondary | ICD-10-CM | POA: Diagnosis present

## 2015-02-14 NOTE — Progress Notes (Signed)
Patient ID: Timothy Odonnell, male   DOB: 1955/09/01, 59 y.o.   MRN: [id number]     Primary Care Physician: No PCP Per Patient Referring Physician: [facility] f/u   Timothy Odonnell is a 59 y.o. male with a h/o recent surgery for knee replacement. At time of pre op his EKG showed asymptomatic  rate controlled afib, no prior history of same. When he was seen in the afib clinic prior to surgery, he was in SR. During surgery, he had a transient run of Mobitz II heart block. He was seen as OP f/u with cardiology and was in SR. Denies any symptoms that might have been consistent with arrhythmia. A chadsvasc score of one. He was on a blood thinner for a short time after surgery but is now off. His alcohol consumption was quite high prior to surgery but he states he has quit drinking. He is progressing with PT and return of knee to normal function. He will be establishing with a regular PCP within the next few months.  Today, he denies symptoms of palpitations, chest pain, shortness of breath, orthopnea, PND, lower extremity edema, dizziness, presyncope, syncope, or neurologic sequela. The patient is tolerating medications without difficulties and is otherwise without complaint today.   Past Medical History  Diagnosis Date  . Arthritis   . Essential hypertension   . Hyperlipidemia   . GERD (gastroesophageal reflux disease)   . Mental retardation    Past Surgical History  Procedure Laterality Date  . Total knee arthroplasty Right 12/20/2014    Procedure: RIGHT TOTAL KNEE ARTHROPLASTY;  Surgeon: Frederico Hamman, MD;  Location: [facility] OR;  Service: Orthopedics;  Laterality: Right;    Current Outpatient Prescriptions  Medication Sig Dispense Refill  . oxyCODONE-acetaminophen (PERCOCET) 5-325 MG per tablet Take by mouth every 4 (four) hours as needed for severe pain.     No current facility-administered medications for this encounter.    Allergies  Allergen Reactions  . Sulfamethoxazole-Trimethoprim Other  (See Comments)    Unknown     Social History   Social History  . Marital Status: Single    Spouse Name: N/A  . Number of Children: N/A  . Years of Education: N/A   Occupational History  . Unemployed    Social History Main Topics  . Smoking status: Current Some Day Smoker  . Smokeless tobacco: Not on file  . Alcohol Use: Yes  . Drug Use: Yes    Special: Benzodiazepines  . Sexual Activity: Not on file   Other Topics Concern  . Not on file   Social History Narrative    Family History  Problem Relation Age of Onset  . Diabetes Mother   . Emphysema Father   . Hypertension Mother   . Hypertension Brother   . Stroke Neg Hx     ROS- All systems are reviewed and negative except as per the HPI above  Physical Exam: Filed Vitals:   02/14/15 1050  BP: 130/84  Pulse: 79  Height:  (1.88 m)  Weight: 207 lb (93.895 kg)    GEN- The patient is well appearing, alert and oriented x 3 today.   Head- normocephalic, atraumatic Eyes-  Sclera clear, conjunctiva pink Ears- hearing intact Oropharynx- clear Neck- supple, no JVP Lymph- no cervical lymphadenopathy Lungs- Clear to ausculation bilaterally, normal work of breathing Heart- Regular rate and rhythm, no murmurs, rubs or gallops, PMI not laterally displaced GI- soft, NT, ND, + BS Extremities- no clubbing, cyanosis, or edema MS-  no significant deformity or atrophy Skin- no rash or lesion Psych- euthymic mood, full affect Neuro- strength and sensation are intact  EKG- SR with first degree AV block, IRBBB, v rate 79 bpm, Pr int 216 ms,QRS 106 ms, QTc 460 ms. Epic records reviewed.  Assessment and Plan: 1. PAF, asymptomatic Seen x one preop Chadsvasc of ? One HTN documented but is not on antihypertensives and bp normal today. No anticoagulation required at this time  2. Mobitz II HB transient during anesthesia Resolved  3. Alcohol abuse States has stopped drinking since surgery   Establish with pcp as  planned Afib clinic as needed  Lupita Leash C. [facility] [address] [place], [place] [address] [phone]

## 2015-08-11 ENCOUNTER — Encounter: Payer: Self-pay | Admitting: Gastroenterology

## 2015-10-13 ENCOUNTER — Encounter (HOSPITAL_COMMUNITY): Payer: Self-pay | Admitting: Emergency Medicine

## 2015-10-13 ENCOUNTER — Emergency Department (HOSPITAL_COMMUNITY): Payer: Medicaid Other

## 2015-10-13 ENCOUNTER — Emergency Department (HOSPITAL_COMMUNITY)
Admission: EM | Admit: 2015-10-13 | Discharge: 2015-10-13 | Disposition: A | Discharge status: Home / Self Care | Payer: Medicaid Other | Attending: Emergency Medicine | Admitting: Emergency Medicine

## 2015-10-13 DIAGNOSIS — F17200 Nicotine dependence, unspecified, uncomplicated: Secondary | ICD-10-CM | POA: Diagnosis not present

## 2015-10-13 DIAGNOSIS — M1990 Unspecified osteoarthritis, unspecified site: Secondary | ICD-10-CM | POA: Insufficient documentation

## 2015-10-13 DIAGNOSIS — E785 Hyperlipidemia, unspecified: Secondary | ICD-10-CM | POA: Diagnosis not present

## 2015-10-13 DIAGNOSIS — Z96651 Presence of right artificial knee joint: Secondary | ICD-10-CM | POA: Diagnosis not present

## 2015-10-13 DIAGNOSIS — I10 Essential (primary) hypertension: Secondary | ICD-10-CM | POA: Insufficient documentation

## 2015-10-13 DIAGNOSIS — S6991XA Unspecified injury of right wrist, hand and finger(s), initial encounter: Secondary | ICD-10-CM | POA: Diagnosis present

## 2015-10-13 DIAGNOSIS — Y929 Unspecified place or not applicable: Secondary | ICD-10-CM | POA: Insufficient documentation

## 2015-10-13 DIAGNOSIS — S60221A Contusion of right hand, initial encounter: Secondary | ICD-10-CM | POA: Diagnosis not present

## 2015-10-13 DIAGNOSIS — W28XXXA Contact with powered lawn mower, initial encounter: Secondary | ICD-10-CM | POA: Diagnosis not present

## 2015-10-13 DIAGNOSIS — Y9389 Activity, other specified: Secondary | ICD-10-CM | POA: Diagnosis not present

## 2015-10-13 DIAGNOSIS — Y999 Unspecified external cause status: Secondary | ICD-10-CM | POA: Diagnosis not present

## 2015-10-13 MED ORDER — NAPROXEN 500 MG PO TABS: 500.0000 mg | ORAL_TABLET | Freq: Two times a day (BID) | ORAL | Status: DC

## 2015-10-13 NOTE — ED Provider Notes (Signed)
CSN: [id number]     Arrival date & time 10/13/15  1359 History   First MD Initiated Contact with Patient 10/13/15 1725     Chief Complaint  Patient presents with  . Hand Injury  . Hand Pain   HPI Patient presents to emergency room for evaluation of a hand injury. Patient was trying to start his lawnmower. He was pulling the crank when the rope broke and his hand flew back and smacked into the metal bar. This injury occurred yesterday. Patient's had persistent swelling of his right hand since the injury. It also hurts to move his fingers. He denies any numbness or weakness. Past Medical History  Diagnosis Date  . Arthritis   . Essential hypertension   . Hyperlipidemia   . GERD (gastroesophageal reflux disease)   . Mental retardation    Past Surgical History  Procedure Laterality Date  . Total knee arthroplasty Right 12/20/2014    Procedure: RIGHT TOTAL KNEE ARTHROPLASTY;  Surgeon: Frederico Hammananiel Caffrey, MD;  [facility];  Service: Orthopedics;  Laterality: Right;   Family History  Problem Relation Age of Onset  . Diabetes Mother   . Emphysema Father   . Hypertension Mother   . Hypertension Brother   . Stroke Neg Hx    Social History  Substance Use Topics  . Smoking status: Current Some Day Smoker  . Smokeless tobacco: None  . Alcohol Use: Yes    Review of Systems  All other systems reviewed and are negative.     Allergies  Sulfamethoxazole-trimethoprim  Home Medications   Prior to Admission medications   Medication Sig Start Date End Date Taking? Authorizing Provider  allopurinol (ZYLOPRIM) 300 MG tablet Take 300 mg by mouth daily. 09/12/15  Yes Historical Provider, MD   BP 124/87 mmHg  Pulse 88  Temp(Src) 98.1 F (36.7 C) (Oral)  Resp 18  SpO2 98% Physical Exam  Constitutional: He appears well-developed and well-nourished. No distress.  HENT:  Head: Normocephalic and atraumatic.  Right Ear: External ear normal.  Left Ear: External ear normal.  Eyes:  Conjunctivae are normal. Right eye exhibits no discharge. Left eye exhibits no discharge. No scleral icterus.  Neck: Neck supple. No tracheal deviation present.  Cardiovascular: Normal rate.   Pulmonary/Chest: Effort normal. No stridor. No respiratory distress.  Musculoskeletal: He exhibits tenderness. He exhibits no edema.  Edema and tenderness of the dorsal aspect of the hand, no break in the skin, no erythema  Neurological: He is alert. Cranial nerve deficit: no gross deficits.  Skin: Skin is warm and dry. No rash noted.  Psychiatric: He has a normal mood and affect.  Nursing note and vitals reviewed.   ED Course  Procedures (including critical care time) Labs Review Labs Reviewed - No data to display  Imaging Review Dg Hand Complete Right  10/13/2015  CLINICAL DATA:  Acute right hand pain after a lawnmower injury. Initial encounter. EXAM: RIGHT HAND - COMPLETE 3+ VIEW COMPARISON:  None. FINDINGS: There is no evidence of fracture or dislocation. There is no evidence of arthropathy or other focal bone abnormality. Soft tissues are unremarkable. IMPRESSION: No significant abnormality seen in the right hand. Electronically Signed   By: Lupita RaiderJames  Green Jr, M.D.   On: 10/13/2015 14:56   I have personally reviewed and evaluated these images and lab results as part of my medical decision-making.    MDM   Final diagnoses:  Contusion, hand, right, initial encounter    Patient's x-rays are negative. His symptoms are  related to a contusion to the hand. Patient's had some improvement with an ice pack. Plan on discharge home with NSAIDs. Follow up with a hand doctor if symptoms don't improve in the next week.    Linwood Dibbles, MD 10/13/15 [phone]

## 2015-10-13 NOTE — ED Notes (Signed)
Patient states that he was trying to crank his lawnmower yesterday when he pulled the crank 3rd time it broke and he hit his hand on lawnmower. States that he had pain but today swollen and very painful.  Went to MD office but didn't have way to xray it so they sent patient here.

## 2015-10-13 NOTE — Discharge Instructions (Signed)
Hand Contusion  A hand contusion is a deep bruise on your hand area. Contusions are the result of an injury that caused bleeding under the skin. The contusion may turn blue, purple, or yellow. Minor injuries will give you a painless contusion, but more severe contusions may stay painful and swollen for a few weeks.  CAUSES   A contusion is usually caused by a blow, trauma, or direct force to an area of the body.  SYMPTOMS    Swelling and redness of the injured area.   Discoloration of the injured area.   Tenderness and soreness of the injured area.   Pain.  DIAGNOSIS   The diagnosis can be made by taking a history and performing a physical exam. An X-ray, CT scan, or MRI may be needed to determine if there were any associated injuries, such as broken bones (fractures).  TREATMENT   Often, the best treatment for a hand contusion is resting, elevating, icing, and applying cold compresses to the injured area. Over-the-counter medicines may also be recommended for pain control.  HOME CARE INSTRUCTIONS    Put ice on the injured area.    Put ice in a plastic bag.    Place a towel between your skin and the bag.    Leave the ice on for 15-20 minutes, 03-04 times a day.   Only take over-the-counter or prescription medicines as directed by your caregiver. Your caregiver may recommend avoiding anti-inflammatory medicines (aspirin, ibuprofen, and naproxen) for 48 hours because these medicines may increase bruising.   If told, use an elastic wrap as directed. This can help reduce swelling. You may remove the wrap for sleeping, showering, and bathing. If your fingers become numb, cold, or blue, take the wrap off and reapply it more loosely.   Elevate your hand with pillows to reduce swelling.   Avoid overusing your hand if it is painful.  SEEK IMMEDIATE MEDICAL CARE IF:    You have increased redness, swelling, or pain in your hand.   Your swelling or pain is not relieved with medicines.   You have loss of feeling in  your hand or are unable to move your fingers.   Your hand turns cold or blue.   You have pain when you move your fingers.   Your hand becomes warm to the touch.   Your contusion does not improve in 2 days.  MAKE SURE YOU:    Understand these instructions.   Will watch your condition.   Will get help right away if you are not doing well or get worse.     This information is not intended to replace advice given to you by your health care provider. Make sure you discuss any questions you have with your health care provider.     Document Released: 10/30/2001 Document Revised: 02/02/2012 Document Reviewed: 11/01/2011  Elsevier Interactive Patient Education 2016 [employer]

## 2015-12-06 ENCOUNTER — Emergency Department (HOSPITAL_COMMUNITY)
Admission: EM | Admit: 2015-12-06 | Discharge: 2015-12-06 | Disposition: A | Discharge status: Home / Self Care | Payer: Medicaid Other | Attending: Emergency Medicine | Admitting: Emergency Medicine

## 2015-12-06 ENCOUNTER — Encounter (HOSPITAL_COMMUNITY): Payer: Self-pay

## 2015-12-06 DIAGNOSIS — M19042 Primary osteoarthritis, left hand: Secondary | ICD-10-CM | POA: Diagnosis not present

## 2015-12-06 DIAGNOSIS — E785 Hyperlipidemia, unspecified: Secondary | ICD-10-CM | POA: Insufficient documentation

## 2015-12-06 DIAGNOSIS — M79642 Pain in left hand: Secondary | ICD-10-CM | POA: Diagnosis present

## 2015-12-06 DIAGNOSIS — I10 Essential (primary) hypertension: Secondary | ICD-10-CM | POA: Diagnosis not present

## 2015-12-06 DIAGNOSIS — F17200 Nicotine dependence, unspecified, uncomplicated: Secondary | ICD-10-CM | POA: Diagnosis not present

## 2015-12-06 DIAGNOSIS — M19049 Primary osteoarthritis, unspecified hand: Secondary | ICD-10-CM

## 2015-12-06 MED ORDER — OXYCODONE-ACETAMINOPHEN 5-325 MG PO TABS: 1.0000 | ORAL_TABLET | Freq: Three times a day (TID) | ORAL | Status: DC | PRN

## 2015-12-06 NOTE — Discharge Instructions (Signed)
Please read and follow all provided instructions.  Your diagnoses today include:  1. Left hand pain    Tests performed today include:  Vital signs. See below for your results today.   Medications prescribed:   Take as prescribed   Home care instructions:  Follow any educational materials contained in this packet.  *PRICE in the first 24-48 hours after injury: Protect (with brace, splint, sling), if given by your provider Rest Ice- Do not apply ice pack directly to your skin, place towel or similar between your skin and ice/ice pack. Apply ice for 20 min, then remove for 40 min while awake Compression- Wear brace, elastic bandage, splint as directed by your provider Elevate affected extremity above the level of your heart when not walking around for the first 24-48 hours   Use Ibuprofen (Motrin/Advil) 600mg  every 8 hours as needed for pain   Follow-up instructions: Please follow-up with your primary care provider for further evaluation of symptoms and treatment   Return instructions:   Please return to the Emergency Department if you do not get better, if you get worse, or new symptoms OR  - Fever (temperature greater than 101.54F)  - Bleeding that does not stop with holding pressure to the area    -Severe pain (please note that you may be more sore the day after your accident)  - Chest Pain  - Difficulty breathing  - Severe nausea or vomiting  - Inability to tolerate food and liquids  - Passing out  - Skin becoming red around your wounds  - Change in mental status (confusion or lethargy)  - New numbness or weakness     Please return if you have any other emergent concerns.  Additional Information:  Your vital signs today were: BP 116/77 mmHg   Pulse 72   Temp(Src) 98.3 F (36.8 C) (Oral)   Resp 20   Ht 6\' 2"  (1.88 m)   Wt 88.451 kg   BMI 25.03 kg/m2   SpO2 97% If your blood pressure (BP) was elevated above 135/85 this visit, please have this repeated by your doctor  within one month. ---------------

## 2015-12-06 NOTE — ED Notes (Signed)
Pt complaining of bilateral hand pain. Pt states he has arthritis and needs pain medicine. Both hands appear swollen. L hand appears more so.  A&Ox4.

## 2015-12-06 NOTE — ED Provider Notes (Signed)
CSN: [id number]     Arrival date & time 12/06/15  2112 History   First MD Initiated Contact with Patient 12/06/15 2202     Chief Complaint  Patient presents with  . Hand Pain    bilaterally   (Consider location/radiation/quality/duration/timing/severity/associated sxs/prior Treatment) HPI 60 y.o. male with a hx of Arthritis, Gout, presents to the Emergency Department today complaining of left hand pain x 3 weeks. States hx of the same. No new trauma to the area. No falls. States pain is 10/10 and feels like a throbbing sensation. Seen by PCP x 2 weeks ago and diagnosed with Given medications to treat gout. Has follow up appointment in 2 weeks. No fevers. No N/V. No rash. No other symptoms noted.   Past Medical History  Diagnosis Date  . Arthritis   . Essential hypertension   . Hyperlipidemia   . GERD (gastroesophageal reflux disease)   . Mental retardation    Past Surgical History  Procedure Laterality Date  . Total knee arthroplasty Right 12/20/2014    Procedure: RIGHT TOTAL KNEE ARTHROPLASTY;  Surgeon: Frederico Hammananiel Caffrey, MD;  [facility];  Service: Orthopedics;  Laterality: Right;   Family History  Problem Relation Age of Onset  . Diabetes Mother   . Emphysema Father   . Hypertension Mother   . Hypertension Brother   . Stroke Neg Hx    Social History  Substance Use Topics  . Smoking status: Current Some Day Smoker  . Smokeless tobacco: None  . Alcohol Use: Yes    Review of Systems  Constitutional: Negative for fever.  Gastrointestinal: Negative for nausea and vomiting.  Musculoskeletal: Positive for arthralgias.  Skin: Negative for rash and wound.   Allergies  Sulfamethoxazole-trimethoprim  Home Medications   Prior to Admission medications   Medication Sig Start Date End Date Taking? Authorizing Provider  allopurinol (ZYLOPRIM) 300 MG tablet Take 300 mg by mouth daily. 09/12/15   Historical Provider, MD  naproxen (NAPROSYN) 500 MG tablet Take 1 tablet (500 mg  total) by mouth 2 (two) times daily with a meal. As needed for pain 10/13/15   Linwood DibblesJon Knapp, MD  traZODone (DESYREL) 50 MG tablet Take 50 mg by mouth at bedtime.    Historical Provider, MD   BP 116/77 mmHg  Pulse 72  Temp(Src) 98.3 F (36.8 C) (Oral)  Resp 20  Ht 6\' 2"  (1.88 m)  Wt 88.451 kg  BMI 25.03 kg/m2  SpO2 97%   Physical Exam  Constitutional: He is oriented to person, place, and time. He appears well-developed and well-nourished.  HENT:  Head: Normocephalic and atraumatic.  Eyes: EOM are normal.  Neck: Normal range of motion.  Cardiovascular: Normal rate and regular rhythm.   Pulmonary/Chest: Effort normal.  Abdominal: Soft.  Musculoskeletal: Normal range of motion.       Left wrist: He exhibits normal range of motion, no tenderness, no bony tenderness, no swelling, no effusion, no crepitus, no deformity and no laceration.  Left Hand along MCP with moderate swelling. Non erythematous. Non TTP. ROM reduced due to swelling. No signs of infection. Neurovascularly intact. Good distal pulses. Cap refill <2sec. Motor/sensation intact.   Neurological: He is alert and oriented to person, place, and time.  Skin: Skin is warm and dry.  Psychiatric: He has a normal mood and affect. His behavior is normal. Thought content normal.  Nursing note and vitals reviewed.   ED Course  Procedures (including critical care time) Labs Review Labs Reviewed - No data to display  Imaging Review No results found. I have personally reviewed and evaluated these images and lab results as part of my medical decision-making.   EKG Interpretation None      MDM  I have reviewed the relevant previous healthcare records. I obtained HPI from historian.  ED Course:  Assessment: Pt is a 59yM with hx Arthritis, Gout who presents with left hand pain x 3 weeks. Seen by [facility] and Dx with gout. No new trauma t area. No fall. Here for continued pain. On exam, pt in NAD. Nontoxic/nonseptic appearing. VSS.  Afebrile. Left hand with no visible signs of infection. No erythema. Moderate swelling along MCPs. [place] drug database reviewed. Given percocet #5. Plan is to DC Home with follow up to PCP. Likely arthritis related as it is apparent on right hand as well. Urged to see PCP. Currently being treated with NSAIDs for Gout. At time of discharge, Patient is in no acute distress. Vital Signs are stable. Patient is able to ambulate. Patient able to tolerate PO.    Disposition/Plan:  DC Home Additional Verbal discharge instructions given and discussed with patient.  Pt Instructed to f/u with PCP in the next week for evaluation and treatment of symptoms. Return precautions given Pt acknowledges and agrees with plan  Supervising Physician Tilden Fossa, MD   Final diagnoses:  Left hand pain  Arthritis of hand      Audry Pili, PA-C 12/06/15 2211  Tilden Fossa, MD 12/07/15 [phone]

## 2016-09-22 IMAGING — DX DG KNEE COMPLETE 4+V*L*
4 series · 4 of 4 positions shown · non-contrast
Comparison: No priors.

CLINICAL DATA: 58-year-old male complaining of pain and swelling in
bilateral knees.

EXAM:
LEFT KNEE - COMPLETE 4+ VIEW

[knee ap]
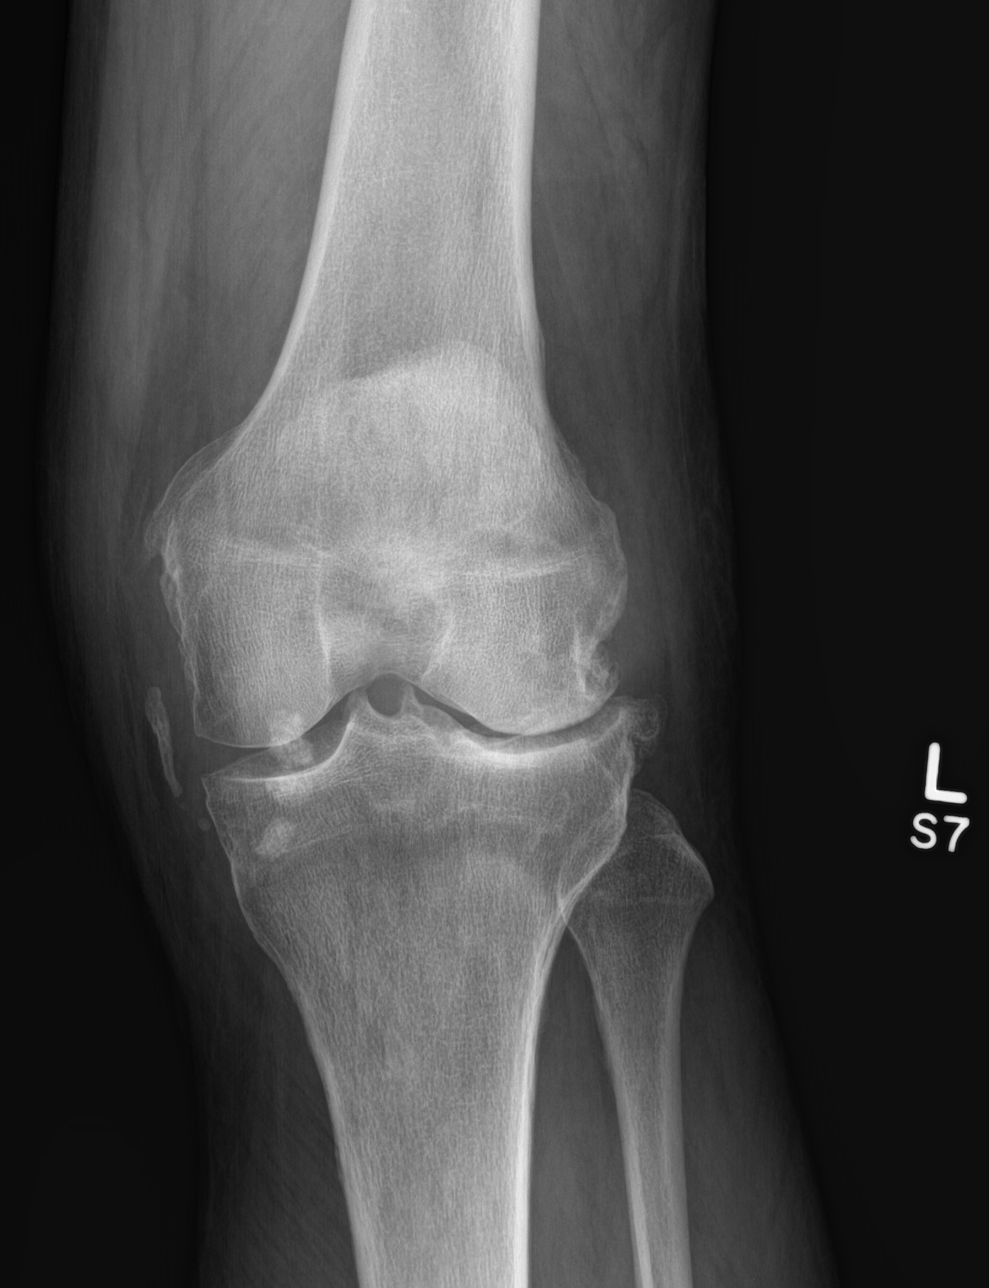

[knee obl (1 of 2)]
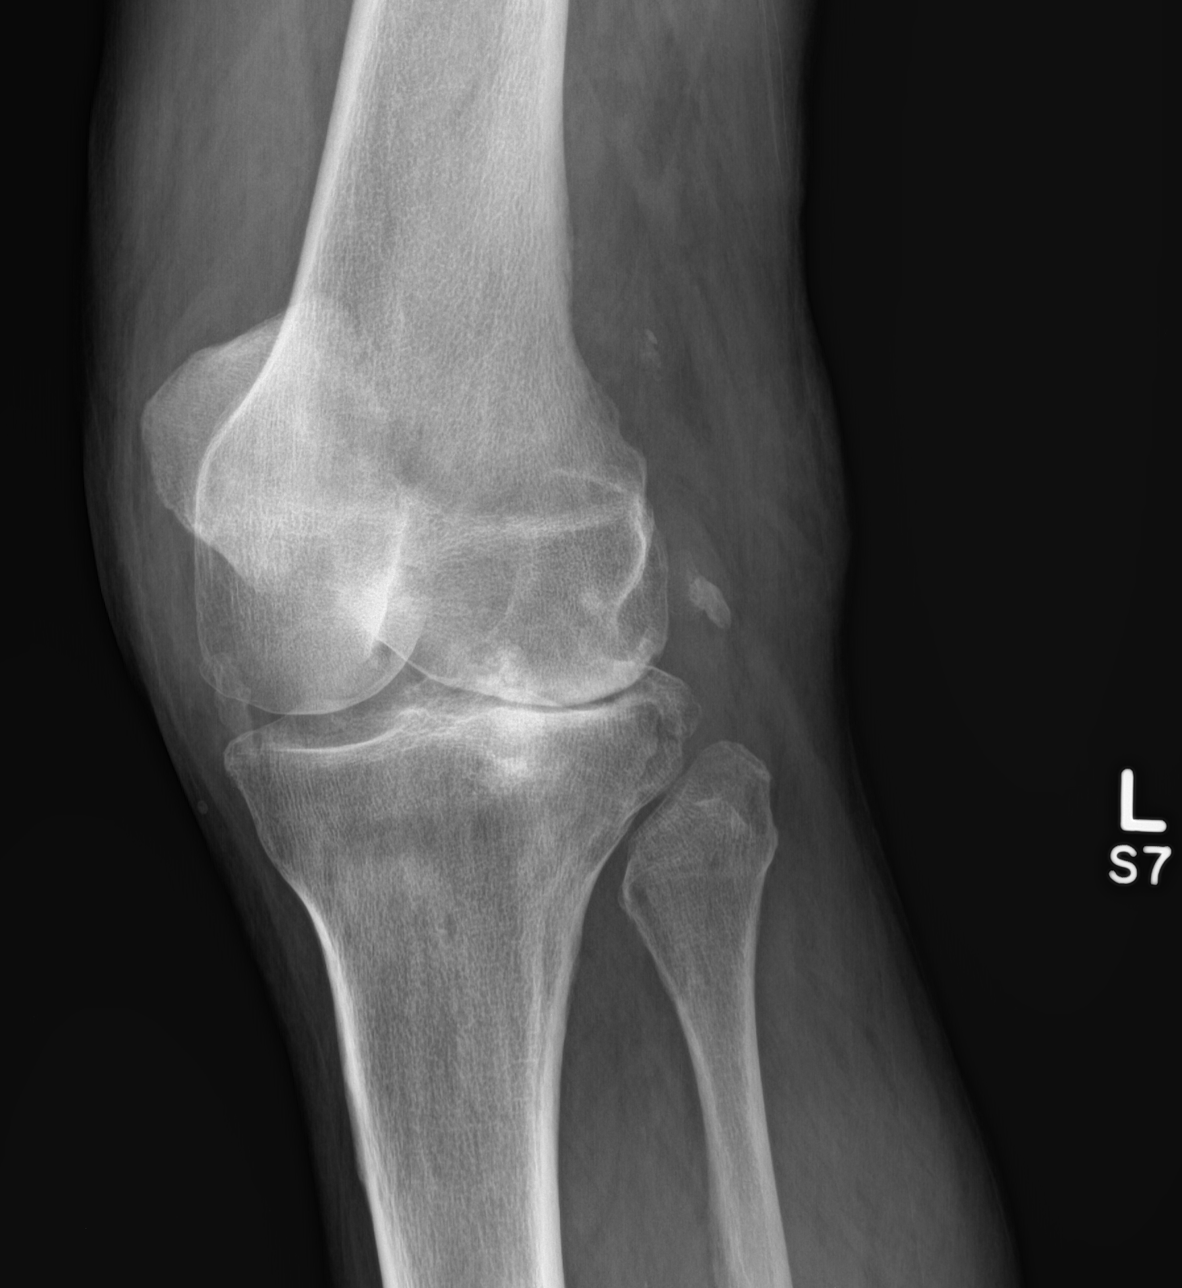

[knee obl (2 of 2)]
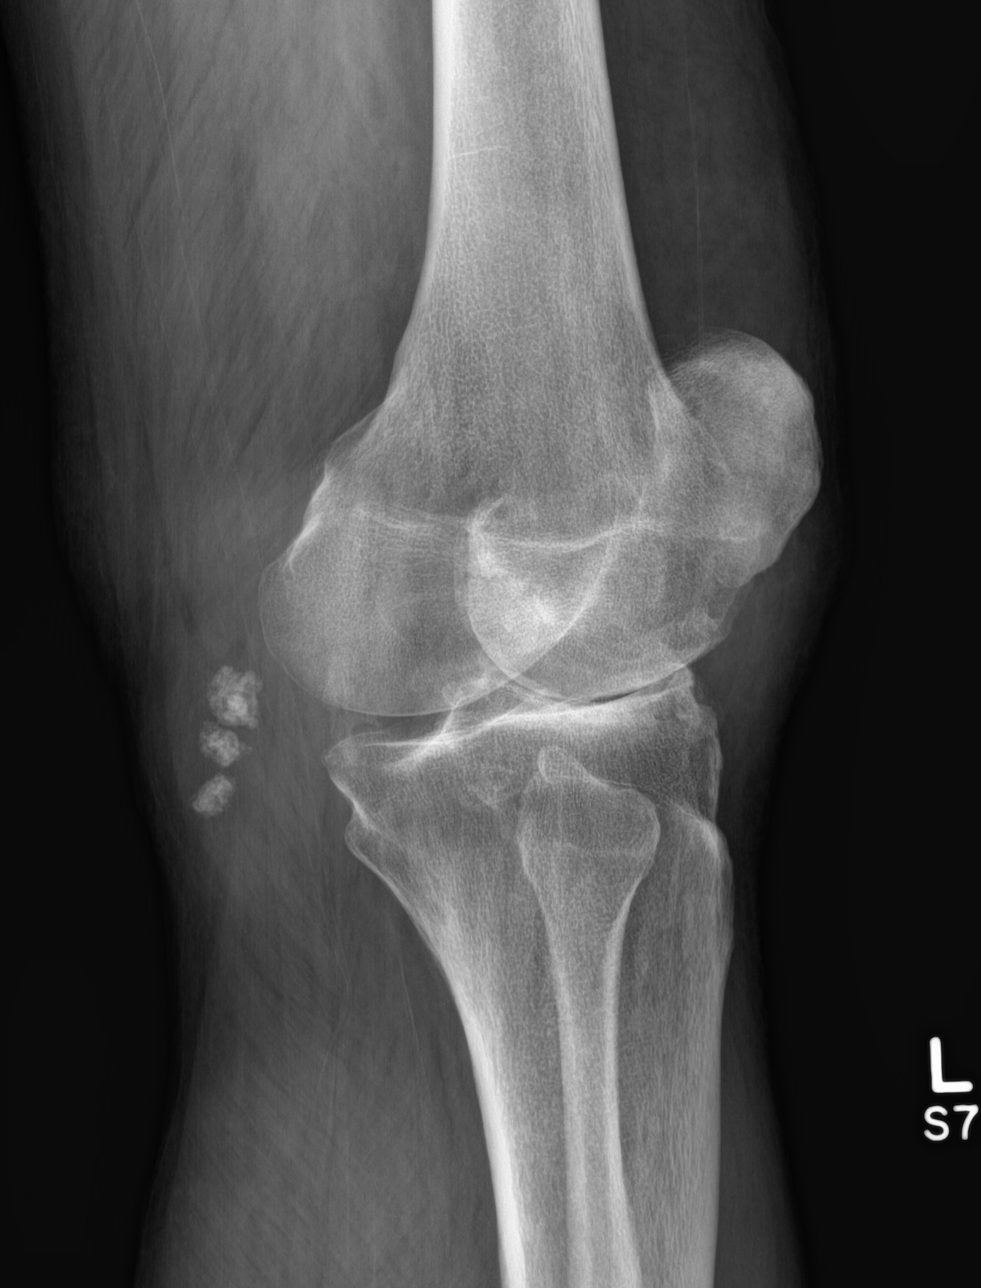

[knee lat]
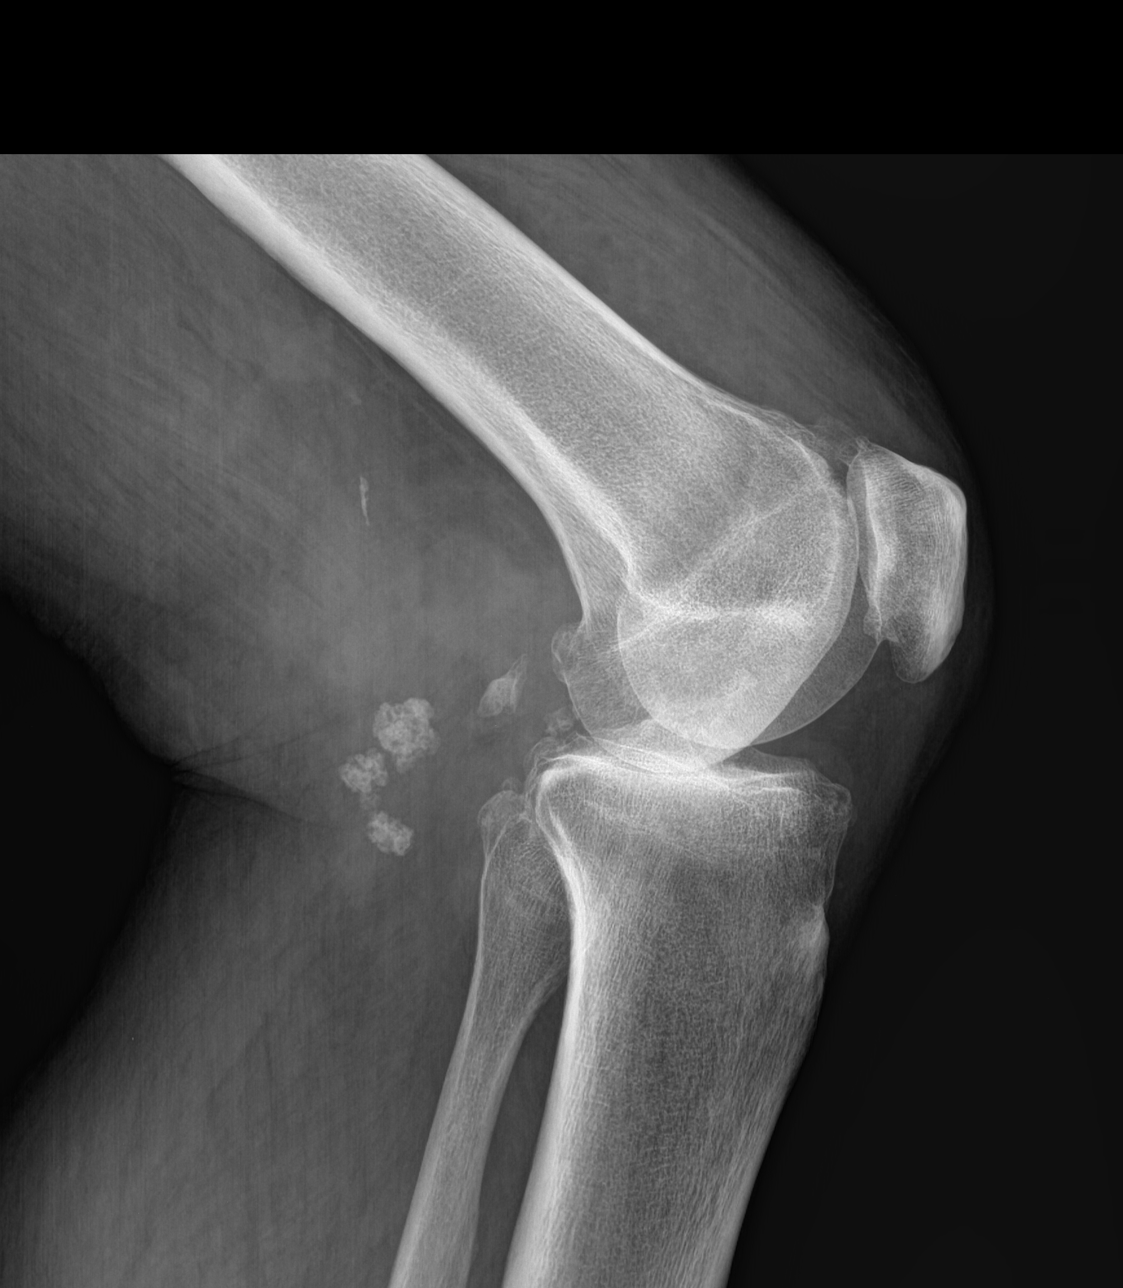

[4 of 4 positions shown; findings below may reference images not displayed]

FINDINGS: Several soft tissue calcifications are noted posterior to the joint
space, particularly medially. Mild ossification of the medial
collateral ligament. Multifocal joint space narrowing, subchondral
sclerosis, subchondral cyst formation and osteophyte formation, most
severe in the lateral and patellofemoral compartments, compatible
with osteoarthritis.
IMPRESSION: 1. No acute radiographic abnormality of the left knee.
2. Moderate tricompartmental osteoarthritis, most severe in the
lateral and patellofemoral compartments.

## 2017-02-25 ENCOUNTER — Emergency Department (HOSPITAL_COMMUNITY): Payer: Medicaid Other

## 2017-02-25 ENCOUNTER — Emergency Department (HOSPITAL_COMMUNITY)
Admission: EM | Admit: 2017-02-25 | Discharge: 2017-02-25 | Disposition: A | Discharge status: Home / Self Care | Payer: Medicaid Other | Attending: Emergency Medicine | Admitting: Emergency Medicine

## 2017-02-25 ENCOUNTER — Encounter (HOSPITAL_COMMUNITY): Payer: Self-pay | Admitting: *Deleted

## 2017-02-25 DIAGNOSIS — F70 Mild intellectual disabilities: Secondary | ICD-10-CM | POA: Insufficient documentation

## 2017-02-25 DIAGNOSIS — F10929 Alcohol use, unspecified with intoxication, unspecified: Secondary | ICD-10-CM | POA: Insufficient documentation

## 2017-02-25 DIAGNOSIS — F10920 Alcohol use, unspecified with intoxication, uncomplicated: Secondary | ICD-10-CM

## 2017-02-25 DIAGNOSIS — Z79899 Other long term (current) drug therapy: Secondary | ICD-10-CM | POA: Diagnosis not present

## 2017-02-25 DIAGNOSIS — F17210 Nicotine dependence, cigarettes, uncomplicated: Secondary | ICD-10-CM | POA: Diagnosis not present

## 2017-02-25 DIAGNOSIS — R4182 Altered mental status, unspecified: Secondary | ICD-10-CM | POA: Diagnosis not present

## 2017-02-25 DIAGNOSIS — Z96651 Presence of right artificial knee joint: Secondary | ICD-10-CM | POA: Diagnosis not present

## 2017-02-25 MED ORDER — TETANUS-DIPHTHERIA TOXOIDS TD 5-2 LFU IM INJ
0.5000 mL | INJECTION | Freq: Once | INTRAMUSCULAR | Status: AC
  Administered 2017-02-25: 0.5 mL via INTRAMUSCULAR
  Filled 2017-02-25: qty 0.5
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
## 2017-02-25 NOTE — ED Notes (Signed)
Bed: [facility] Expected date:  Expected time:  Means of arrival:  Comments: 
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
## 2017-02-25 NOTE — ED Notes (Signed)
Patient is resting comfortably. 

## 2017-02-25 NOTE — ED Notes (Signed)
ED Provider at bedside. 

## 2017-02-25 NOTE — ED Notes (Signed)
Pharm at bedside

## 2017-05-03 ENCOUNTER — Emergency Department (HOSPITAL_COMMUNITY)
Admission: EM | Admit: 2017-05-03 | Discharge: 2017-05-04 | Discharge status: Against Medical Advice | Payer: Medicaid Other

## 2017-05-03 ENCOUNTER — Encounter (HOSPITAL_COMMUNITY): Payer: Self-pay | Admitting: Family Medicine
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
## 2017-05-04 ENCOUNTER — Other Ambulatory Visit: Payer: Self-pay

## 2017-05-04 ENCOUNTER — Observation Stay (HOSPITAL_COMMUNITY)
Admission: EM | Admit: 2017-05-04 | Discharge: 2017-05-06 | Disposition: A | Discharge status: Home / Self Care | Payer: Medicaid Other | Attending: Internal Medicine | Admitting: Internal Medicine

## 2017-05-04 DIAGNOSIS — I959 Hypotension, unspecified: Secondary | ICD-10-CM | POA: Diagnosis not present

## 2017-05-04 DIAGNOSIS — I10 Essential (primary) hypertension: Secondary | ICD-10-CM | POA: Insufficient documentation

## 2017-05-04 DIAGNOSIS — F17200 Nicotine dependence, unspecified, uncomplicated: Secondary | ICD-10-CM | POA: Diagnosis not present

## 2017-05-04 DIAGNOSIS — E860 Dehydration: Secondary | ICD-10-CM | POA: Insufficient documentation

## 2017-05-04 DIAGNOSIS — X31XXXA Exposure to excessive natural cold, initial encounter: Secondary | ICD-10-CM | POA: Diagnosis not present

## 2017-05-04 DIAGNOSIS — Z96651 Presence of right artificial knee joint: Secondary | ICD-10-CM | POA: Insufficient documentation

## 2017-05-04 DIAGNOSIS — K7010 Alcoholic hepatitis without ascites: Secondary | ICD-10-CM | POA: Diagnosis not present

## 2017-05-04 DIAGNOSIS — E872 Acidosis, unspecified: Secondary | ICD-10-CM | POA: Diagnosis present

## 2017-05-04 DIAGNOSIS — K219 Gastro-esophageal reflux disease without esophagitis: Secondary | ICD-10-CM | POA: Insufficient documentation

## 2017-05-04 DIAGNOSIS — Z79899 Other long term (current) drug therapy: Secondary | ICD-10-CM | POA: Diagnosis not present

## 2017-05-04 DIAGNOSIS — F1010 Alcohol abuse, uncomplicated: Secondary | ICD-10-CM | POA: Diagnosis not present

## 2017-05-04 DIAGNOSIS — E785 Hyperlipidemia, unspecified: Secondary | ICD-10-CM | POA: Diagnosis not present

## 2017-05-04 DIAGNOSIS — M25569 Pain in unspecified knee: Secondary | ICD-10-CM

## 2017-05-04 DIAGNOSIS — Z882 Allergy status to sulfonamides status: Secondary | ICD-10-CM | POA: Diagnosis not present

## 2017-05-04 DIAGNOSIS — M25562 Pain in left knee: Secondary | ICD-10-CM | POA: Insufficient documentation

## 2017-05-04 DIAGNOSIS — Z23 Encounter for immunization: Secondary | ICD-10-CM | POA: Insufficient documentation

## 2017-05-04 DIAGNOSIS — F79 Unspecified intellectual disabilities: Secondary | ICD-10-CM | POA: Insufficient documentation

## 2017-05-04 DIAGNOSIS — Z789 Other specified health status: Secondary | ICD-10-CM

## 2017-05-04 DIAGNOSIS — Z7289 Other problems related to lifestyle: Secondary | ICD-10-CM

## 2017-05-04 LAB — CBC WITH DIFFERENTIAL/PLATELET
BASOS ABS: 0 10*3/uL (ref 0.0–0.1)
BASOS PCT: 0 %
EOS ABS: 0 10*3/uL (ref 0.0–0.7)
Eosinophils Relative: 0 %
HEMATOCRIT: 38.6 % — AB (ref 39.0–52.0)
Hemoglobin: 13.3 g/dL (ref 13.0–17.0)
Lymphocytes Relative: 11 %
Lymphs Abs: 1.3 10*3/uL (ref 0.7–4.0)
MCH: 32.3 pg (ref 26.0–34.0)
MCHC: 34.5 g/dL (ref 30.0–36.0)
MCV: 93.7 fL (ref 78.0–100.0)
MONO ABS: 0.5 10*3/uL (ref 0.1–1.0)
Monocytes Relative: 4 %
NEUTROS ABS: 9.7 10*3/uL — AB (ref 1.7–7.7)
NEUTROS PCT: 85 %
Platelets: 154 10*3/uL (ref 150–400)
RBC: 4.12 MIL/uL — ABNORMAL LOW (ref 4.22–5.81)
RDW: 13.8 % (ref 11.5–15.5)
WBC: 11.5 10*3/uL — ABNORMAL HIGH (ref 4.0–10.5)

## 2017-05-04 LAB — I-STAT CG4 LACTIC ACID, ED: LACTIC ACID, VENOUS: 4.05 mmol/L — AB (ref 0.5–1.9)

## 2017-05-04 MED ORDER — SODIUM CHLORIDE 0.9 % IV BOLUS (SEPSIS)
1000.0000 mL | Freq: Once | INTRAVENOUS | Status: AC
  Administered 2017-05-04: 1000 mL via INTRAVENOUS

## 2017-05-04 NOTE — ED Notes (Signed)
Pt is c/o pain in his knee. He stated he is hungry. He is unable to elaborate more then that at this time. He keeps repeating himself and really would like food. He stated that EMS told him he was cold.

## 2017-05-04 NOTE — ED Triage Notes (Signed)
Per EMS: Pt called out wanting someone to take him home. Had been outside for 2 and a half hours.  BP initially was 80 palp and felt cold to touch.  Smells of etoh.

## 2017-05-04 NOTE — ED Notes (Signed)
Called pt, but no response from the waiting room.

## 2017-05-04 NOTE — ED Provider Notes (Signed)
[facility]-EMERGENCY DEPT Provider Note   CSN: [id number] Arrival date & time: 05/04/17  2219     History   Chief Complaint Chief Complaint  Patient presents with  . Cold Exposure  . Hypotension    HPI Timothy Odonnell is a 61 y.o. male.  The history is provided by the patient and medical records.    61 year old male with history of arthritis, hypertension, GERD, hyperlipidemia, mental retardation, presenting to the ED with cold exposure.  Patient reports he was drinking beer and decided to go for a walk which was "stupid".  States he was just bored but realizes he probably should have just taken a nap.  Reports he was walking but got tired so he just decided to sit down outside.  He estimates he was outside for about 4 hours.  He apparently called EMS to take him home but was hypotensive and he was brought here.  Blood pressure upon my evaluation remains low at 68/43.  He does feel cool to touch.  Patient's only complaint at this time is that he is hungry.  Past Medical History:  Diagnosis Date  . Arthritis   . Essential hypertension   . GERD (gastroesophageal reflux disease)   . Hyperlipidemia   . Mental retardation     Patient Active Problem List   Diagnosis Date Noted  . Primary localized osteoarthritis of right knee 12/20/2014  . BARRETTS ESOPHAGUS 02/02/2010  . ALCOHOL ABUSE 01/22/2010  . ALCOHOLIC HEPATITIS 01/06/2010  . LOSS OF WEIGHT 11/25/2009  . FATTY LIVER DISEASE 10/27/2009  . VITAMIN D DEFICIENCY 09/29/2009  . ELECTROCARDIOGRAM, ABNORMAL 09/26/2009  . LIVER FUNCTION TESTS, ABNORMAL, HX OF 09/19/2009  . PSYCHIATRIC DISORDER 08/26/2009  . FATIGUE 10/23/2008  . HYPERLIPIDEMIA 11/24/2006  . HYPERTENSION 11/24/2006  . GERD 11/24/2006  . OSTEOARTHRITIS 11/24/2006  . RETARDATION, MENTAL, MILD 11/07/2006    Past Surgical History:  Procedure Laterality Date  . TOTAL KNEE ARTHROPLASTY Right 12/20/2014   Procedure: RIGHT TOTAL KNEE  ARTHROPLASTY;  Surgeon: Frederico Hammananiel Caffrey, MD;  [facility];  Service: Orthopedics;  Laterality: Right;       Home Medications    Prior to Admission medications   Not on File    Family History Family History  Problem Relation Age of Onset  . Diabetes Mother   . Hypertension Mother   . Emphysema Father   . Hypertension Brother   . Stroke Neg Hx     Social History Social History   Tobacco Use  . Smoking status: Current Some Day Smoker  Substance Use Topics  . Alcohol use: Yes    Comment: Daily.   . Drug use: Yes    Types: Benzodiazepines     Allergies   Sulfamethoxazole-trimethoprim   Review of Systems Review of Systems  Constitutional:       EtOH, hypotension  All other systems reviewed and are negative.    Physical Exam Updated Vital Signs BP (!) 68/43   Pulse 68   Temp (!) 97.4 F (36.3 C) (Oral)   Resp 14   SpO2 100%   Physical Exam  Constitutional: He is oriented to person, place, and time. He appears well-developed and well-nourished.  Smells of alcohol  HENT:  Head: Normocephalic and atraumatic.  Mouth/Throat: Oropharynx is clear and moist.  Eyes: Conjunctivae and EOM are normal. Pupils are equal, round, and reactive to light.  Neck: Normal range of motion.  Cardiovascular: Normal rate, regular rhythm and normal heart sounds.  Pulmonary/Chest: Effort  normal and breath sounds normal. No stridor. No respiratory distress.  Abdominal: Soft. Bowel sounds are normal. There is no tenderness. There is no rebound.  Musculoskeletal: Normal range of motion.  Neurological: He is alert and oriented to person, place, and time.  Skin: Skin is warm and dry.  Trunk is warm but extremities are cool to touch; no signs of frostbite on the fingers  Psychiatric: He has a normal mood and affect.  Nursing note and vitals reviewed.    ED Treatments / Results  Labs (all labs ordered are listed, but only abnormal results are displayed) Labs Reviewed  CBC  WITH DIFFERENTIAL/PLATELET - Abnormal; Notable for the following components:      Result Value   WBC 11.5 (*)    RBC 4.12 (*)    HCT 38.6 (*)    Neutro Abs 9.7 (*)    All other components within normal limits  ETHANOL - Abnormal; Notable for the following components:   Alcohol, Ethyl (B) 223 (*)    All other components within normal limits  COMPREHENSIVE METABOLIC PANEL - Abnormal; Notable for the following components:   Potassium 3.4 (*)    CO2 17 (*)    Glucose, Bld 55 (*)    Creatinine, Ser 1.25 (*)    Calcium 8.4 (*)    GFR calc non Af Amer 60 (*)    All other components within normal limits  URINALYSIS, ROUTINE W REFLEX MICROSCOPIC - Abnormal; Notable for the following components:   Hgb urine dipstick SMALL (*)    Ketones, ur 5 (*)    Squamous Epithelial / LPF 0-5 (*)    All other components within normal limits  I-STAT CG4 LACTIC ACID, ED - Abnormal; Notable for the following components:   Lactic Acid, Venous 4.05 (*)    All other components within normal limits  I-STAT CG4 LACTIC ACID, ED - Abnormal; Notable for the following components:   Lactic Acid, Venous 3.37 (*)    All other components within normal limits  I-STAT CG4 LACTIC ACID, ED - Abnormal; Notable for the following components:   Lactic Acid, Venous 3.44 (*)    All other components within normal limits  I-STAT CHEM 8, ED - Abnormal; Notable for the following components:   Calcium, Ion 0.99 (*)    TCO2 20 (*)    Hemoglobin 11.2 (*)    HCT 33.0 (*)    All other components within normal limits  RAPID URINE DRUG SCREEN, HOSP PERFORMED    EKG  EKG Interpretation None       Radiology No results found.  Procedures Procedures (including critical care time)  Medications Ordered in ED Medications  sodium chloride 0.9 % bolus 1,000 mL (0 mLs Intravenous Stopped 05/05/17 0131)    Followed by  sodium chloride 0.9 % bolus 1,000 mL (0 mLs Intravenous Stopped 05/05/17 0131)  sodium chloride 0.9 % bolus  1,000 mL (0 mLs Intravenous Stopped 05/05/17 0637)  sodium chloride 0.9 % bolus 1,000 mL (0 mLs Intravenous Stopped 05/05/17 0637)  gi cocktail (Maalox,Lidocaine,Donnatal) (30 mLs Oral Given 05/05/17 0516)     Initial Impression / Assessment and Plan / ED Course  I have reviewed the triage vital signs and the nursing notes.  Pertinent labs & imaging results that were available during my care of the patient were reviewed by me and considered in my medical decision making (see chart for details).  61 year old male presenting to the ED with cold exposure and hypotension.  He was 80  systolic with EMS.  His only complaint to them was that he wanted to be taken home, however was brought here due to low pressures.  On arrival patient's blood pressure 68/43, however he is awake, alert, and conversant.  He does smell of alcohol.  His core temperature is essentially normal.  He denies any recent illness and does not appear septic.  Suspect his hypotension due to cold exposure and his alcohol intake which is somewhat chronic.  Will plan for IV fluids, screening labs.  Patient requesting to eat, was given food and drink as well as warm blankets.  Initial lactate 4.05.  Will continue IVF, BP improving.  Ethanol 223.  Patient eating and drinking well here.  Will continue to monitor.  Attending physician, Dr. Fayrene FearingJames, has evaluated patient-- feels if lactate clears, can likely be discharged home.  Lactate improved after 2L but still high at 3.37.  Additional 2L ordered.  BP now 100's systolic.  Patient remains alert and conversant.  He has been up and out of bed to the bathroom a few times independently.  After additional 2 L of fluid, patient's lactate has started rising again, now 3.44.  Patient has continued eating and drinking here.  He has no complaints other than being hungry.  His blood pressure has remained stable for the past few hours.  At this point, uncertain etiology of patient's lactic acidosis.  He  does not have any signs of infection.  Was not truly hypothermic here.  He does not endorse any pain.  UA without signs of infection.  Have added on chest x-ray, however patient without any cough, shortness of breath, hypoxia, or tachycardia.  Given patient's prolonged ED course with continued lactic acidosis, I do feel he will require admission.  Hospitalist consulted.  7:30 AM End of shift, still awaiting call back from hospitalist.  Dr. Nicanor AlconPalumbo to speak with admitting team about case.  Final Clinical Impressions(s) / ED Diagnoses   Final diagnoses:  Lactic acidosis    ED Discharge Orders    None       Garlon HatchetSanders, Brayah Urquilla M, PA-C 05/05/17 0731    Rolland PorterJames, Mark, MD 05/12/17 [phone]

## 2017-05-04 NOTE — ED Notes (Signed)
Bed: WA09 Expected date:  Expected time:  Means of arrival:  Comments: 61 yo M hypotensive, hypothermic

## 2017-05-05 ENCOUNTER — Other Ambulatory Visit: Payer: Self-pay

## 2017-05-05 ENCOUNTER — Observation Stay (HOSPITAL_COMMUNITY): Payer: Medicaid Other

## 2017-05-05 ENCOUNTER — Emergency Department (HOSPITAL_COMMUNITY): Payer: Medicaid Other

## 2017-05-05 DIAGNOSIS — M25562 Pain in left knee: Secondary | ICD-10-CM | POA: Diagnosis not present

## 2017-05-05 DIAGNOSIS — F10930 Alcohol use, unspecified with withdrawal, uncomplicated: Secondary | ICD-10-CM | POA: Insufficient documentation

## 2017-05-05 DIAGNOSIS — E872 Acidosis, unspecified: Secondary | ICD-10-CM | POA: Diagnosis present

## 2017-05-05 DIAGNOSIS — G8929 Other chronic pain: Secondary | ICD-10-CM | POA: Diagnosis not present

## 2017-05-05 DIAGNOSIS — F10230 Alcohol dependence with withdrawal, uncomplicated: Secondary | ICD-10-CM | POA: Diagnosis not present

## 2017-05-05 DIAGNOSIS — M25569 Pain in unspecified knee: Secondary | ICD-10-CM | POA: Insufficient documentation

## 2017-05-05 LAB — I-STAT CG4 LACTIC ACID, ED
LACTIC ACID, VENOUS: 3.44 mmol/L — AB (ref 0.5–1.9)
Lactic Acid, Venous: 3.37 mmol/L (ref 0.5–1.9)

## 2017-05-05 LAB — I-STAT CHEM 8, ED
BUN: 19 mg/dL (ref 6–20)
Calcium, Ion: 0.99 mmol/L — ABNORMAL LOW (ref 1.15–1.40)
Chloride: 108 mmol/L (ref 101–111)
Creatinine, Ser: 0.9 mg/dL (ref 0.61–1.24)
Glucose, Bld: 73 mg/dL (ref 65–99)
HEMATOCRIT: 33 % — AB (ref 39.0–52.0)
HEMOGLOBIN: 11.2 g/dL — AB (ref 13.0–17.0)
POTASSIUM: 3.8 mmol/L (ref 3.5–5.1)
SODIUM: 140 mmol/L (ref 135–145)
TCO2: 20 mmol/L — AB (ref 22–32)

## 2017-05-05 LAB — BASIC METABOLIC PANEL
Anion gap: 7 (ref 5–15)
BUN: 17 mg/dL (ref 6–20)
CHLORIDE: 108 mmol/L (ref 101–111)
CO2: 22 mmol/L (ref 22–32)
CREATININE: 0.87 mg/dL (ref 0.61–1.24)
Calcium: 7.9 mg/dL — ABNORMAL LOW (ref 8.9–10.3)
GFR calc Af Amer: >60 mL/min (ref >60)
GFR calc non Af Amer: >60 mL/min (ref >60)
Glucose, Bld: 121 mg/dL — ABNORMAL HIGH (ref 65–99)
Potassium: 3.9 mmol/L (ref 3.5–5.1)
Sodium: 137 mmol/L (ref 135–145)

## 2017-05-05 LAB — COMPREHENSIVE METABOLIC PANEL
ALK PHOS: 65 U/L (ref 38–126)
ALT: 17 U/L (ref 17–63)
AST: 31 U/L (ref 15–41)
Albumin: 3.8 g/dL (ref 3.5–5.0)
Anion gap: 15 (ref 5–15)
BILIRUBIN TOTAL: 0.7 mg/dL (ref 0.3–1.2)
BUN: 15 mg/dL (ref 6–20)
CALCIUM: 8.4 mg/dL — AB (ref 8.9–10.3)
CO2: 17 mmol/L — ABNORMAL LOW (ref 22–32)
CREATININE: 1.25 mg/dL — AB (ref 0.61–1.24)
Chloride: 107 mmol/L (ref 101–111)
GFR, EST NON AFRICAN AMERICAN: 60 mL/min — AB (ref >60)
Glucose, Bld: 55 mg/dL — ABNORMAL LOW (ref 65–99)
Potassium: 3.4 mmol/L — ABNORMAL LOW (ref 3.5–5.1)
Sodium: 139 mmol/L (ref 135–145)
TOTAL PROTEIN: 7.4 g/dL (ref 6.5–8.1)

## 2017-05-05 LAB — URINALYSIS, ROUTINE W REFLEX MICROSCOPIC
Bacteria, UA: NONE SEEN
Bilirubin Urine: NEGATIVE
GLUCOSE, UA: NEGATIVE mg/dL
Ketones, ur: 5 mg/dL — AB
Leukocytes, UA: NEGATIVE
Nitrite: NEGATIVE
PH: 5 (ref 5.0–8.0)
Protein, ur: NEGATIVE mg/dL
SPECIFIC GRAVITY, URINE: 1.011 (ref 1.005–1.030)

## 2017-05-05 LAB — RAPID URINE DRUG SCREEN, HOSP PERFORMED
Amphetamines: NOT DETECTED
BENZODIAZEPINES: NOT DETECTED
Barbiturates: NOT DETECTED
COCAINE: NOT DETECTED
OPIATES: NOT DETECTED
Tetrahydrocannabinol: NOT DETECTED

## 2017-05-05 LAB — OSMOLALITY: Osmolality: 291 mOsm/kg (ref 275–295)

## 2017-05-05 LAB — LACTIC ACID, PLASMA
Lactic Acid, Venous: 1.9 mmol/L (ref 0.5–1.9)
Lactic Acid, Venous: 1.3 mmol/L (ref 0.5–1.9)

## 2017-05-05 LAB — ETHANOL: Alcohol, Ethyl (B): 223 mg/dL — ABNORMAL HIGH (ref <10)

## 2017-05-05 MED ORDER — VITAMIN B-1 100 MG PO TABS
100.0000 mg | ORAL_TABLET | Freq: Every day | ORAL | Status: DC
  Administered 2017-05-05 – 2017-05-06 (×2): 100 mg via ORAL
  Filled 2017-05-05 (×2): qty 1

## 2017-05-05 MED ORDER — SODIUM CHLORIDE 0.9 % IV BOLUS (SEPSIS)
1000.0000 mL | Freq: Once | INTRAVENOUS | Status: AC
  Administered 2017-05-05: 1000 mL via INTRAVENOUS

## 2017-05-05 MED ORDER — THIAMINE HCL 100 MG/ML IJ SOLN
Freq: Once | INTRAVENOUS | Status: AC
  Administered 2017-05-05: 10:00:00 via INTRAVENOUS
  Filled 2017-05-05: qty 1000

## 2017-05-05 MED ORDER — LORAZEPAM 2 MG/ML IJ SOLN: 1.0000 mg | Freq: Four times a day (QID) | INTRAMUSCULAR | Status: DC | PRN

## 2017-05-05 MED ORDER — ACETAMINOPHEN 325 MG PO TABS
650.0000 mg | ORAL_TABLET | Freq: Four times a day (QID) | ORAL | Status: DC | PRN
  Administered 2017-05-06 (×2): 650 mg via ORAL
  Filled 2017-05-05 (×2): qty 2

## 2017-05-05 MED ORDER — ACETAMINOPHEN 650 MG RE SUPP: 650.0000 mg | Freq: Four times a day (QID) | RECTAL | Status: DC | PRN

## 2017-05-05 MED ORDER — ONDANSETRON HCL 4 MG/2ML IJ SOLN: 4.0000 mg | Freq: Four times a day (QID) | INTRAMUSCULAR | Status: DC | PRN

## 2017-05-05 MED ORDER — THIAMINE HCL 100 MG/ML IJ SOLN
100.0000 mg | Freq: Every day | INTRAMUSCULAR | Status: DC
  Filled 2017-05-05 (×2): qty 2

## 2017-05-05 MED ORDER — ENOXAPARIN SODIUM 40 MG/0.4ML ~~LOC~~ SOLN: 40.0000 mg | SUBCUTANEOUS | Status: DC

## 2017-05-05 MED ORDER — GI COCKTAIL ~~LOC~~
30.0000 mL | Freq: Once | ORAL | Status: AC
  Administered 2017-05-05: 30 mL via ORAL
  Filled 2017-05-05: qty 30

## 2017-05-05 MED ORDER — LORAZEPAM 1 MG PO TABS
1.0000 mg | ORAL_TABLET | Freq: Four times a day (QID) | ORAL | Status: DC | PRN
  Administered 2017-05-06: 1 mg via ORAL
  Filled 2017-05-05: qty 1

## 2017-05-05 MED ORDER — SENNOSIDES-DOCUSATE SODIUM 8.6-50 MG PO TABS: 1.0000 | ORAL_TABLET | Freq: Every evening | ORAL | Status: DC | PRN

## 2017-05-05 MED ORDER — ADULT MULTIVITAMIN W/MINERALS CH
1.0000 | ORAL_TABLET | Freq: Every day | ORAL | Status: DC
  Administered 2017-05-05 – 2017-05-06 (×2): 1 via ORAL
  Filled 2017-05-05 (×2): qty 1

## 2017-05-05 MED ORDER — HYDRALAZINE HCL 20 MG/ML IJ SOLN
5.0000 mg | Freq: Four times a day (QID) | INTRAMUSCULAR | Status: DC | PRN
  Administered 2017-05-05: 5 mg via INTRAVENOUS
  Filled 2017-05-05: qty 1

## 2017-05-05 MED ORDER — FOLIC ACID 1 MG PO TABS
1.0000 mg | ORAL_TABLET | Freq: Every day | ORAL | Status: DC
  Administered 2017-05-05 – 2017-05-06 (×2): 1 mg via ORAL
  Filled 2017-05-05 (×2): qty 1

## 2017-05-05 MED ORDER — INFLUENZA VAC SPLIT QUAD 0.5 ML IM SUSY
0.5000 mL | PREFILLED_SYRINGE | INTRAMUSCULAR | Status: AC
  Administered 2017-05-06: 0.5 mL via INTRAMUSCULAR
  Filled 2017-05-05: qty 0.5

## 2017-05-05 MED ORDER — ONDANSETRON HCL 4 MG PO TABS: 4.0000 mg | ORAL_TABLET | Freq: Four times a day (QID) | ORAL | Status: DC | PRN

## 2017-05-05 NOTE — ED Notes (Signed)
Pt was given a urinal and is trying to provide a sample.

## 2017-05-05 NOTE — ED Notes (Addendum)
Pt has a lactic of 3.44 EDP and RN Stephaine made aware

## 2017-05-05 NOTE — ED Notes (Signed)
ED TO INPATIENT HANDOFF REPORT  Name/Age/Gender Timothy Odonnell 61 y.o. male  Code Status    Code Status Orders  (From admission, onward)        Start     Ordered   05/05/17 0838  Full code  Continuous     05/05/17 0838    Code Status History    Date Active Date Inactive Code Status Order ID Comments User Context   12/20/2014 13:40 12/21/2014 19:49 Full Code [id number]  Chriss Czar, PA-C Inpatient      Home/SNF/Other Home  Chief Complaint hypothermic, hypotensive  Level of Care/Admitting Diagnosis ED Disposition    ED Disposition Condition [place] [facility] Area: [facility] [[id number]]  Level of Care: Med-Surg [16]  Diagnosis: Lactic acidosis [638756]  Admitting Physician: Rosita Fire [[id number]]  Attending Physician: Rosita Fire [[id number]]  PT Class (Do Not Modify): Observation [104]  PT Acc Code (Do Not Modify): Observation [10022]       Medical History Past Medical History:  Diagnosis Date  . Arthritis   . Essential hypertension   . GERD (gastroesophageal reflux disease)   . Hyperlipidemia   . Mental retardation     Allergies Allergies  Allergen Reactions  . Sulfamethoxazole-Trimethoprim Other (See Comments)    Unknown     IV Location/Drains/Wounds Patient Lines/Drains/Airways Status   Active Line/Drains/Airways    Name:   Placement date:   Placement time:   Site:   Days:   Peripheral IV 05/04/17 Left Forearm   05/04/17    -    Forearm   1   Incision (Closed) 12/20/14 Knee   12/20/14    1030     867          Labs/Imaging Results for orders placed or performed during the hospital encounter of 05/04/17 (from the past 48 hour(s))  CBC with Differential     Status: Abnormal   Collection Time: 05/04/17 11:34 PM  Result Value Ref Range   WBC 11.5 (H) 4.0 - 10.5 K/uL   RBC 4.12 (L) 4.22 - 5.81 MIL/uL   Hemoglobin 13.3 13.0 - 17.0 g/dL   HCT 38.6 (L) 39.0 - 52.0 %   MCV 93.7 78.0 - 100.0 fL   MCH  32.3 26.0 - 34.0 pg   MCHC 34.5 30.0 - 36.0 g/dL   RDW 13.8 11.5 - 15.5 %   Platelets 154 150 - 400 K/uL   Neutrophils Relative % 85 %   Neutro Abs 9.7 (H) 1.7 - 7.7 K/uL   Lymphocytes Relative 11 %   Lymphs Abs 1.3 0.7 - 4.0 K/uL   Monocytes Relative 4 %   Monocytes Absolute 0.5 0.1 - 1.0 K/uL   Eosinophils Relative 0 %   Eosinophils Absolute 0.0 0.0 - 0.7 K/uL   Basophils Relative 0 %   Basophils Absolute 0.0 0.0 - 0.1 K/uL  Ethanol     Status: Abnormal   Collection Time: 05/04/17 11:34 PM  Result Value Ref Range   Alcohol, Ethyl (B) 223 (H) <10 mg/dL    Comment:        LOWEST DETECTABLE LIMIT FOR SERUM ALCOHOL IS 10 mg/dL FOR MEDICAL PURPOSES ONLY   Comprehensive metabolic panel     Status: Abnormal   Collection Time: 05/04/17 11:34 PM  Result Value Ref Range   Sodium 139 135 - 145 mmol/L   Potassium 3.4 (L) 3.5 - 5.1 mmol/L   Chloride 107 101 - 111 mmol/L   CO2  17 (L) 22 - 32 mmol/L   Glucose, Bld 55 (L) 65 - 99 mg/dL   BUN 15 6 - 20 mg/dL   Creatinine, Ser 1.25 (H) 0.61 - 1.24 mg/dL   Calcium 8.4 (L) 8.9 - 10.3 mg/dL   Total Protein 7.4 6.5 - 8.1 g/dL   Albumin 3.8 3.5 - 5.0 g/dL   AST 31 15 - 41 U/L   ALT 17 17 - 63 U/L   Alkaline Phosphatase 65 38 - 126 U/L   Total Bilirubin 0.7 0.3 - 1.2 mg/dL   GFR calc non Af Amer 60 (L) >60 mL/min   GFR calc Af Amer >60 >60 mL/min    Comment: (NOTE) The eGFR has been calculated using the CKD EPI equation. This calculation has not been validated in all clinical situations. eGFR's persistently <60 mL/min signify possible Chronic Kidney Disease.    Anion gap 15 5 - 15  I-Stat CG4 Lactic Acid, ED     Status: Abnormal   Collection Time: 05/04/17 11:43 PM  Result Value Ref Range   Lactic Acid, Venous 4.05 (HH) 0.5 - 1.9 mmol/L   Comment NOTIFIED PHYSICIAN   I-Stat CG4 Lactic Acid, ED     Status: Abnormal   Collection Time: 05/05/17  1:04 AM  Result Value Ref Range   Lactic Acid, Venous 3.37 (HH) 0.5 - 1.9 mmol/L    Comment NOTIFIED PHYSICIAN   Rapid urine drug screen (hospital performed)     Status: None   Collection Time: 05/05/17  4:00 AM  Result Value Ref Range   Opiates NONE DETECTED NONE DETECTED   Cocaine NONE DETECTED NONE DETECTED   Benzodiazepines NONE DETECTED NONE DETECTED   Amphetamines NONE DETECTED NONE DETECTED   Tetrahydrocannabinol NONE DETECTED NONE DETECTED   Barbiturates NONE DETECTED NONE DETECTED    Comment:        DRUG SCREEN FOR MEDICAL PURPOSES ONLY.  IF CONFIRMATION IS NEEDED FOR ANY PURPOSE, NOTIFY LAB WITHIN 5 DAYS.        LOWEST DETECTABLE LIMITS FOR URINE DRUG SCREEN Drug Class       Cutoff (ng/mL) Amphetamine      1000 Barbiturate      200 Benzodiazepine   200 Tricyclics       300 Opiates          300 Cocaine          300 THC              50   Urinalysis, Routine w reflex microscopic     Status: Abnormal   Collection Time: 05/05/17  4:00 AM  Result Value Ref Range   Color, Urine YELLOW YELLOW   APPearance CLEAR CLEAR   Specific Gravity, Urine 1.011 1.005 - 1.030   pH 5.0 5.0 - 8.0   Glucose, UA NEGATIVE NEGATIVE mg/dL   Hgb urine dipstick SMALL (A) NEGATIVE   Bilirubin Urine NEGATIVE NEGATIVE   Ketones, ur 5 (A) NEGATIVE mg/dL   Protein, ur NEGATIVE NEGATIVE mg/dL   Nitrite NEGATIVE NEGATIVE   Leukocytes, UA NEGATIVE NEGATIVE   RBC / HPF 0-5 0 - 5 RBC/hpf   WBC, UA 0-5 0 - 5 WBC/hpf   Bacteria, UA NONE SEEN NONE SEEN   Squamous Epithelial / LPF 0-5 (A) NONE SEEN   Mucus PRESENT    Hyaline Casts, UA PRESENT   I-Stat CG4 Lactic Acid, ED     Status: Abnormal   Collection Time: 05/05/17  6:34 AM  Result Value Ref Range     Lactic Acid, Venous 3.44 (HH) 0.5 - 1.9 mmol/L   Comment NOTIFIED PHYSICIAN   I-stat chem 8, ed     Status: Abnormal   Collection Time: 05/05/17  6:52 AM  Result Value Ref Range   Sodium 140 135 - 145 mmol/L   Potassium 3.8 3.5 - 5.1 mmol/L   Chloride 108 101 - 111 mmol/L   BUN 19 6 - 20 mg/dL   Creatinine, Ser 0.90 0.61 -  1.24 mg/dL   Glucose, Bld 73 65 - 99 mg/dL   Calcium, Ion 0.99 (L) 1.15 - 1.40 mmol/L   TCO2 20 (L) 22 - 32 mmol/L   Hemoglobin 11.2 (L) 13.0 - 17.0 g/dL   HCT 33.0 (L) 39.0 - 82.4 %  Basic metabolic panel     Status: Abnormal   Collection Time: 05/05/17  9:23 AM  Result Value Ref Range   Sodium 137 135 - 145 mmol/L   Potassium 3.9 3.5 - 5.1 mmol/L   Chloride 108 101 - 111 mmol/L   CO2 22 22 - 32 mmol/L   Glucose, Bld 121 (H) 65 - 99 mg/dL   BUN 17 6 - 20 mg/dL   Creatinine, Ser 0.87 0.61 - 1.24 mg/dL   Calcium 7.9 (L) 8.9 - 10.3 mg/dL   GFR calc non Af Amer >60 >60 mL/min   GFR calc Af Amer >60 >60 mL/min    Comment: (NOTE) The eGFR has been calculated using the CKD EPI equation. This calculation has not been validated in all clinical situations. eGFR's persistently <60 mL/min signify possible Chronic Kidney Disease.    Anion gap 7 5 - 15  Osmolality     Status: None   Collection Time: 05/05/17  9:23 AM  Result Value Ref Range   Osmolality 291 275 - 295 mOsm/kg    Comment: Performed at [facility] [address], [place], [place] [address]  Lactic acid, plasma     Status: None   Collection Time: 05/05/17  9:24 AM  Result Value Ref Range   Lactic Acid, Venous 1.9 0.5 - 1.9 mmol/L  Lactic acid, plasma     Status: None   Collection Time: 05/05/17 11:45 AM  Result Value Ref Range   Lactic Acid, Venous 1.3 0.5 - 1.9 mmol/L   Dg Chest 2 View  Result Date: 05/05/2017 CLINICAL DATA:  61 year old male status post exposure to the cold. Hypotension, elevated lactate. EXAM: CHEST  2 VIEW COMPARISON:  12/09/2014, chest CT 01/26/2006. FINDINGS: Semi upright AP and lateral views of the chest. Cardiac size is at the upper limits of normal. Other mediastinal contours are within normal limits. Visualized tracheal air column is within normal limits. Stable lung volumes. No pneumothorax, pulmonary edema, pleural effusion or confluent pulmonary opacity. Advanced chronic degenerative  changes about both shoulders, worse on the left. No acute osseous abnormality identified. Negative visible bowel gas pattern. IMPRESSION: No acute cardiopulmonary abnormality. Electronically Signed   By: Genevie Ann M.D.   On: 05/05/2017 08:19   Dg Knee 1-2 Views Left  Result Date: 05/05/2017 CLINICAL DATA:  Chronic left knee pain. EXAM: LEFT KNEE - 1-2 VIEW COMPARISON:  07/25/2014 FINDINGS: Moderate to advanced degenerative changes in the left knee, most pronounced in the lateral compartment with joint space loss and spurring. Moderate joint effusion. Stable ossification of the medial collateral ligament. No fracture, subluxation or dislocation. IMPRESSION: Moderate to advanced degenerative joint disease. Moderate joint effusion. No acute findings. Electronically Signed   By: Rolm Baptise M.D.  On: 05/05/2017 09:19    Pending Labs Unresulted Labs (From admission, onward)   Start     Ordered   05/12/17 0500  Creatinine, serum  (enoxaparin (LOVENOX)    CrCl >/= 30 ml/min)  Weekly,   R    Comments:  while on enoxaparin therapy    05/05/17 0838   05/06/17 0500  Magnesium  Tomorrow morning,   R     05/05/17 0840      Vitals/Pain Today's Vitals   05/05/17 1300 05/05/17 1315 05/05/17 1400 05/05/17 1500  BP: 125/86 132/88 134/86 (!) 138/92  Pulse: 76 83 73 71  Resp:  13 12 12  Temp:      TempSrc:      SpO2:  96%      Isolation Precautions No active isolations  Medications Medications  enoxaparin (LOVENOX) injection 40 mg (40 mg Subcutaneous Not Given 05/05/17 1033)  acetaminophen (TYLENOL) tablet 650 mg (not administered)    Or  acetaminophen (TYLENOL) suppository 650 mg (not administered)  senna-docusate (Senokot-S) tablet 1 tablet (not administered)  ondansetron (ZOFRAN) tablet 4 mg (not administered)    Or  ondansetron (ZOFRAN) injection 4 mg (not administered)  LORazepam (ATIVAN) tablet 1 mg (not administered)    Or  LORazepam (ATIVAN) injection 1 mg (not administered)   thiamine (VITAMIN B-1) tablet 100 mg (100 mg Oral Given 05/05/17 1012)    Or  thiamine (B-1) injection 100 mg ( Intravenous See Alternative 05/05/17 1012)  folic acid (FOLVITE) tablet 1 mg (1 mg Oral Given 05/05/17 1012)  multivitamin with minerals tablet 1 tablet (1 tablet Oral Given 05/05/17 1012)  hydrALAZINE (APRESOLINE) injection 5 mg (not administered)  Influenza vac split quadrivalent PF (FLUARIX) injection 0.5 mL (not administered)  sodium chloride 0.9 % bolus 1,000 mL (0 mLs Intravenous Stopped 05/05/17 0131)    Followed by  sodium chloride 0.9 % bolus 1,000 mL (0 mLs Intravenous Stopped 05/05/17 0131)  sodium chloride 0.9 % bolus 1,000 mL (0 mLs Intravenous Stopped 05/05/17 0637)  sodium chloride 0.9 % bolus 1,000 mL (0 mLs Intravenous Stopped 05/05/17 0637)  gi cocktail (Maalox,Lidocaine,Donnatal) (30 mLs Oral Given 05/05/17 0516)  sodium chloride 0.9 % 1,000 mL with thiamine 100 mg, folic acid 1 mg, multivitamins adult 10 mL infusion ( Intravenous New Bag/Given 05/05/17 1014)    Mobility walks  

## 2017-05-05 NOTE — H&P (Signed)
History and Physical    Timothy JoeLuther W Ebrahimi WUJ:[id number] DOB: June 06, 1955 DOA: 05/04/2017  PCP: Patient, No Pcp Per  Patient coming from:Home  Chief Complaint: Left knee pain and hypotension  HPI: Timothy Odonnell is a 61 y.o. male with medical history significant of hypertension, arthritis, acid reflux, hyperlipidemia and possibly mental retardation presented to the ER after patient was exposed to the cold associated with hypotension.  Patient reported he was drinking beer and decided to go for a walk in the cold.  He felt tired.  He called EMS to take him home when he was found to be hypotensive.  On arrival to the ER patient blood pressure was 68/43.  He was complaining of hungry when he was in the ER.  During my examination, patient reported left knee pain therefore he came to hospital.  He was eating breakfast.  He denied fever, chills, nausea vomiting chest pain shortness of breath.  Patient denies smoking but reported drinking alcohol almost every day.  He lives by himself.  ED Course: He received IV fluid boluses in the ER for hypertension.  Blood pressure already improved.  Found to have elevated lactic acid level which did not improve with IV fluid.  Elevated serum alcohol level.  Admitted for further evaluation.  Chest x-ray and UA unremarkable.  Review of Systems: As per HPI otherwise 10 point review of systems negative.    Past Medical History:  Diagnosis Date  . Arthritis   . Essential hypertension   . GERD (gastroesophageal reflux disease)   . Hyperlipidemia   . Mental retardation     Past Surgical History:  Procedure Laterality Date  . TOTAL KNEE ARTHROPLASTY Right 12/20/2014   Procedure: RIGHT TOTAL KNEE ARTHROPLASTY;  Surgeon: Frederico Hammananiel Caffrey, MD;  [facility];  Service: Orthopedics;  Laterality: Right;    Social history: Reports that he has been smoking.  He does not have any smokeless tobacco history on file. He reports that he drinks alcohol. He reports that he  uses drugs. Drug: Benzodiazepines.  Allergies  Allergen Reactions  . Sulfamethoxazole-Trimethoprim Other (See Comments)    Unknown     Family History  Problem Relation Age of Onset  . Diabetes Mother   . Hypertension Mother   . Emphysema Father   . Hypertension Brother   . Stroke Neg Hx      Prior to Admission medications   Not on File  Reviewed home medication: Patient reported he does not take any medication at home.  Physical Exam: Vitals:   05/05/17 0630 05/05/17 0700 05/05/17 0800 05/05/17 0812  BP: 130/90 139/80 (!) 165/92 (!) 165/92  Pulse: 73 67 74 74  Resp: 11 12 15 15   Temp:      TempSrc:      SpO2: 98%   98%      Constitutional: NAD, calm, comfortable Vitals:   05/05/17 0630 05/05/17 0700 05/05/17 0800 05/05/17 0812  BP: 130/90 139/80 (!) 165/92 (!) 165/92  Pulse: 73 67 74 74  Resp: 11 12 15 15   Temp:      TempSrc:      SpO2: 98%   98%   Eyes: PERRL, lids and conjunctivae normal ENMT: Mucous membranes are dry.  Normal dentition.  Neck: normal, supple Respiratory: clear to auscultation bilaterally, no wheezing, no crackles. Normal respiratory effort. No accessory muscle use.  Cardiovascular: Regular rate and rhythm, S1S2 Normal. no murmurs / rubs / gallops. No extremity edema.   Abdomen: Soft, Non-tender, non-distended. Bowel sounds  positive.  Musculoskeletal: no clubbing / cyanosis. No joint deformity upper and lower extremities. Skin: no rashes, lesions, ulcers. No induration Neurologic: Alert awake and following commands..  Strength 5/5 in all 4.    Labs on Admission: I have personally reviewed following labs and imaging studies  CBC: Recent Labs  Lab 05/04/17 2334 05/05/17 0652  WBC 11.5*  --   NEUTROABS 9.7*  --   HGB 13.3 11.2*  HCT 38.6* 33.0*  MCV 93.7  --   PLT 154  --    Basic Metabolic Panel: Recent Labs  Lab 05/04/17 2334 05/05/17 0652  NA 139 140  K 3.4* 3.8  CL 107 108  CO2 17*  --   GLUCOSE 55* 73  BUN 15 19    CREATININE 1.25* 0.90  CALCIUM 8.4*  --    GFR: CrCl cannot be calculated (Unknown ideal weight.). Liver Function Tests: Recent Labs  Lab 05/04/17 2334  AST 31  ALT 17  ALKPHOS 65  BILITOT 0.7  PROT 7.4  ALBUMIN 3.8   No results for input(s): LIPASE, AMYLASE in the last 168 hours. No results for input(s): AMMONIA in the last 168 hours. Coagulation Profile: No results for input(s): INR, PROTIME in the last 168 hours. Cardiac Enzymes: No results for input(s): CKTOTAL, CKMB, CKMBINDEX, TROPONINI in the last 168 hours. BNP (last 3 results) No results for input(s): PROBNP in the last 8760 hours. HbA1C: No results for input(s): HGBA1C in the last 72 hours. CBG: No results for input(s): GLUCAP in the last 168 hours. Lipid Profile: No results for input(s): CHOL, HDL, LDLCALC, TRIG, CHOLHDL, LDLDIRECT in the last 72 hours. Thyroid Function Tests: No results for input(s): TSH, T4TOTAL, FREET4, T3FREE, THYROIDAB in the last 72 hours. Anemia Panel: No results for input(s): VITAMINB12, FOLATE, FERRITIN, TIBC, IRON, RETICCTPCT in the last 72 hours. Urine analysis:    Component Value Date/Time   COLORURINE YELLOW 05/05/2017 0400   APPEARANCEUR CLEAR 05/05/2017 0400   LABSPEC 1.011 05/05/2017 0400   PHURINE 5.0 05/05/2017 0400   GLUCOSEU NEGATIVE 05/05/2017 0400   HGBUR SMALL (A) 05/05/2017 0400   HGBUR negative 09/26/2009 0941   BILIRUBINUR NEGATIVE 05/05/2017 0400   KETONESUR 5 (A) 05/05/2017 0400   PROTEINUR NEGATIVE 05/05/2017 0400   UROBILINOGEN 0.2 12/09/2014 1123   NITRITE NEGATIVE 05/05/2017 0400   LEUKOCYTESUR NEGATIVE 05/05/2017 0400   Sepsis Labs: !!!!!!!!!!!!!!!!!!!!!!!!!!!!!!!!!!!!!!!!!!!! @LABRCNTIP (procalcitonin:4,lacticidven:4) )No results found for this or any previous visit (from the past 240 hour(s)).   Radiological Exams on Admission: Dg Chest 2 View  Result Date: 05/05/2017 CLINICAL DATA:  61 year old male status post exposure to the cold.  Hypotension, elevated lactate. EXAM: CHEST  2 VIEW COMPARISON:  12/09/2014, chest CT 01/26/2006. FINDINGS: Semi upright AP and lateral views of the chest. Cardiac size is at the upper limits of normal. Other mediastinal contours are within normal limits. Visualized tracheal air column is within normal limits. Stable lung volumes. No pneumothorax, pulmonary edema, pleural effusion or confluent pulmonary opacity. Advanced chronic degenerative changes about both shoulders, worse on the left. No acute osseous abnormality identified. Negative visible bowel gas pattern. IMPRESSION: No acute cardiopulmonary abnormality. Electronically Signed   By: Odessa Fleming M.D.   On: 05/05/2017 08:19    Assessment/Plan Active Problems:   Lactic acidosis  #Lactic acidosis likely due to hypotension, dehydration and possibly poor peripheral perfusion in the setting of cold exposure: Patient also with alcohol toxicity on admission.  Chest x-ray unremarkable, UA unremarkable.  No sign of infection. -Continue IV  fluid, repeat lab -Blood pressure already improved. Repeat temperature acceptable.  #Alcohol abuse and possibly mild alcohol withdrawal: Patient with mild upper extremity tremor, tachycardic and hypertensive.  Start banana bag/IV fluids, vitamins, CIWA protocol with Ativan as needed.  Close monitoring.  #Left knee pain possibly arthritis: Obtain x-ray.  Continue Tylenol and supportive care.  No sign of infection.   Patient was eating breakfast. Case manager consult for discharge planning Continue supportive care.  DVT prophylaxis: Lovenox subcutaneous Code Status: Full code Family Communication: No family at bedside Disposition Plan: MedSurg Consults called: None Admission status: Observation   Masiya Claassen Jaynie CollinsPrasad Claritza July MD [facility] Pager [phone]  If 7PM-7AM, please contact night-coverage [email or web address] Password [email or web address]  05/05/2017, 8:41 AM

## 2017-05-05 NOTE — ED Notes (Addendum)
The pt called out to use the restroom for a BM. The pt started to have a BM in the room. The pt was brought to the restroom to finish the BM. Pt was given a pair of scrub pants to change into.

## 2017-05-06 DIAGNOSIS — M25562 Pain in left knee: Secondary | ICD-10-CM | POA: Diagnosis not present

## 2017-05-06 DIAGNOSIS — Z7289 Other problems related to lifestyle: Secondary | ICD-10-CM

## 2017-05-06 DIAGNOSIS — E872 Acidosis: Secondary | ICD-10-CM | POA: Diagnosis not present

## 2017-05-06 DIAGNOSIS — E860 Dehydration: Secondary | ICD-10-CM

## 2017-05-06 DIAGNOSIS — Z789 Other specified health status: Secondary | ICD-10-CM | POA: Diagnosis not present

## 2017-05-06 DIAGNOSIS — G8929 Other chronic pain: Secondary | ICD-10-CM | POA: Diagnosis not present

## 2017-05-06 LAB — MAGNESIUM: MAGNESIUM: 1.6 mg/dL — AB (ref 1.7–2.4)

## 2017-05-06 MED ORDER — THIAMINE HCL 100 MG PO TABS: 100.0000 mg | ORAL_TABLET | Freq: Every day | ORAL | 3 refills | Status: DC

## 2017-05-06 MED ORDER — MAGNESIUM SULFATE 2 GM/50ML IV SOLN
2.0000 g | Freq: Once | INTRAVENOUS | Status: AC
Start: 2017-05-06 — End: 2017-05-06
  Administered 2017-05-06: 2 g via INTRAVENOUS
  Filled 2017-05-06: qty 50

## 2017-05-06 MED ORDER — CHLORDIAZEPOXIDE HCL 10 MG PO CAPS: ORAL_CAPSULE | ORAL | 0 refills | Status: DC

## 2017-05-06 MED ORDER — ADULT MULTIVITAMIN W/MINERALS CH: 1.0000 | ORAL_TABLET | Freq: Every day | ORAL | 1 refills | Status: DC

## 2017-05-06 MED ORDER — FOLIC ACID 1 MG PO TABS: 1.0000 mg | ORAL_TABLET | Freq: Every day | ORAL | 3 refills | Status: DC

## 2017-05-06 NOTE — Care Management Note (Signed)
Case Management Note  Patient Details  Name: Timothy Odonnell MRN: [id number] Date of Birth: [birth year] [name] Subjective/Objective:                  etoh abuse and hypothermia  Action/Plan: Date:  May 06, 2017 Chart reviewed for concurrent status and case management needs.  Will continue to follow patient progress.  Discharge Planning: following for needs  Expected discharge date: May 09, 2017  Marcelle SmilingRhonda Jaythan Hinely, BSN, [place]etoeRN3, [place]CCM   [phone]   Expected Discharge Date:  (unknown)               Expected Discharge Plan:  Homeless Shelter  In-House Referral:  Clinical Social Work  Discharge planning Services  CM Consult  Post Acute Care Choice:    Choice offered to:     DME Arranged:    DME Agency:     HH Arranged:    HH Agency:     Status of Service:  In process, will continue to follow  If discussed at Long Length of Stay Meetings, dates discussed:    Additional Comments:  Golda AcreDavis, Cindee Mclester Lynn, RN 05/06/2017, 9:48 AM

## 2017-05-06 NOTE — Discharge Summary (Signed)
Physician Discharge Summary  Tally JoeLuther W Belcourt ZOX:[id number] DOB: [birth year] DOA: 05/04/2017  PCP: Patient, No Pcp Per  Admit date: 05/04/2017 Discharge date: 05/06/2017  Time spent: 35 minutes  Recommendations for Outpatient Follow-up:  1. Repeat basic metabolic panel to follow electrolytes and renal function 2. Reassess patient alcohol abstinence and continue supportive pain during the quitting process. 3. Patient will need outpatient follow-up with orthopedic service.   Discharge Diagnoses:  Active Problems:   Lactic acidosis   Alcohol use   Dehydration Left knee pain  Discharge Condition: Stable and improved.  Patient has been discharged home with instruction to follow-up with PCP in 10 days.  Diet recommendation: regular diet   History of present illness:  As per H&P Written by Dr. Ronalee BeltsBhandari on 05/04/60 y.o. male with medical history significant of hypertension, arthritis, acid reflux, hyperlipidemia and possibly mental retardation presented to the ER after patient was exposed to the cold associated with hypotension.  Patient reported he was drinking beer and decided to go for a walk in the cold.  He felt tired.  He called EMS to take him home when he was found to be hypotensive.  On arrival to the ER patient blood pressure was 68/43.  He was complaining of hungry when he was in the ER.  During my examination, patient reported left knee pain therefore he came to hospital.  He was eating breakfast.  He denied fever, chills, nausea vomiting chest pain shortness of breath.  Patient denies smoking but reported drinking alcohol almost every day.  He lives by himself.   Hospital Course:  1-Lactic acidosis: Appears to be secondary to dehydration, hypotension and decreased peripheral perfusion in the setting of cold exposure. -Patient responded fantastically to fluid resuscitation -Lactic acid within normal limits at discharge -Instructed to maintain adequate hydration, to  minimize cold exposure time and to avoid alcohol consumption.  2-alcohol abuse with risk for withdrawal. -Alcohol cessation counseling has been provided -No major withdrawal symptoms appreciated -CIWA score around 4 -patient discharge on tapering librium  -discharge on folic acid and thiamine   3-left knee pain: Chronic -Secondary to osteoarthritis as appreciated on his knee x-ray -PT has seen patient and no need for PT therapy as an outpatient recommended -patient will benefit of follow up with orthopedic service for further evaluation and definitive treatment.  4-dehydration -Resolved with fluid resuscitation. -Advised to keep himself well-hydrated.  Procedures:  See below for x-ray reports.  Consultations:  None  Discharge Exam: Vitals:   05/06/17 0547 05/06/17 1443  BP: 120/77 139/86  Pulse: 67 70  Resp: 18 20  Temp: 98.4 F (36.9 C) 97.8 F (36.6 C)  SpO2: 100% 99%    General: Alert, awake and oriented x3; no chest pain, no shortness of breath, no nausea vomiting.  Patient continued reporting left knee pain with certain movements and while bearing weight. Cardiovascular: S1 and S2, no rubs, no gallops, no JVD. Respiratory: Good air movement bilaterally, no wheezing, no crackles, normal respiratory effort. Abdomen: Soft, nontender, nondistended, positive bowel sounds, no guarding. Extremities: No edema, no cyanosis, no clubbing; patient with some decreased range of motion in his left knee otherwise no swelling, crepitations, warm sensation or erythema appreciated on exam.  Discharge Instructions   Discharge Instructions    Diet - low sodium heart healthy   Complete by:  As directed    Discharge instructions   Complete by:  As directed    Medications as prescribed Stop alcohol abuse Keep yourself well-hydrated Arrange follow-up  with PCP in 10 days.   Increase activity slowly   Complete by:  As directed      Allergies as of 05/06/2017      Reactions    Sulfamethoxazole-trimethoprim Other (See Comments)   Unknown       Medication List    TAKE these medications   chlordiazePOXIDE 10 MG capsule Commonly known as:  LIBRIUM 1 tablet by mouth three times a day for 2 days; then 1 tablet by mouth twice a day for 3 days; then 1 tablet by mouth daily for 3 days; then 1/2 tablet by mouth daily for 3 days and stop medication.   folic acid 1 MG tablet Commonly known as:  FOLVITE Take 1 tablet (1 mg total) by mouth daily. Start taking on:  05/07/2017   multivitamin with minerals Tabs tablet Take 1 tablet by mouth daily. Start taking on:  05/07/2017   thiamine 100 MG tablet Take 1 tablet (100 mg total) by mouth daily. Start taking on:  05/07/2017      Allergies  Allergen Reactions  . Sulfamethoxazole-Trimethoprim Other (See Comments)    Unknown     The results of significant diagnostics from this hospitalization (including imaging, microbiology, ancillary and laboratory) are listed below for reference.    Significant Diagnostic Studies: Dg Chest 2 View  Result Date: 05/05/2017 CLINICAL DATA:  61 year old male status post exposure to the cold. Hypotension, elevated lactate. EXAM: CHEST  2 VIEW COMPARISON:  12/09/2014, chest CT 01/26/2006. FINDINGS: Semi upright AP and lateral views of the chest. Cardiac size is at the upper limits of normal. Other mediastinal contours are within normal limits. Visualized tracheal air column is within normal limits. Stable lung volumes. No pneumothorax, pulmonary edema, pleural effusion or confluent pulmonary opacity. Advanced chronic degenerative changes about both shoulders, worse on the left. No acute osseous abnormality identified. Negative visible bowel gas pattern. IMPRESSION: No acute cardiopulmonary abnormality. Electronically Signed   By: Odessa FlemingH  Hall M.D.   On: 05/05/2017 08:19   Dg Knee 1-2 Views Left  Result Date: 05/05/2017 CLINICAL DATA:  Chronic left knee pain. EXAM: LEFT KNEE - 1-2 VIEW  COMPARISON:  07/25/2014 FINDINGS: Moderate to advanced degenerative changes in the left knee, most pronounced in the lateral compartment with joint space loss and spurring. Moderate joint effusion. Stable ossification of the medial collateral ligament. No fracture, subluxation or dislocation. IMPRESSION: Moderate to advanced degenerative joint disease. Moderate joint effusion. No acute findings. Electronically Signed   By: Charlett NoseKevin  Dover M.D.   On: 05/05/2017 09:19   Labs: Basic Metabolic Panel: Recent Labs  Lab 05/04/17 2334 05/05/17 0652 05/05/17 0923 05/06/17 0606  NA 139 140 137  --   K 3.4* 3.8 3.9  --   CL 107 108 108  --   CO2 17*  --  22  --   GLUCOSE 55* 73 121*  --   BUN 15 19 17   --   CREATININE 1.25* 0.90 0.87  --   CALCIUM 8.4*  --  7.9*  --   MG  --   --   --  1.6*   Liver Function Tests: Recent Labs  Lab 05/04/17 2334  AST 31  ALT 17  ALKPHOS 65  BILITOT 0.7  PROT 7.4  ALBUMIN 3.8   CBC: Recent Labs  Lab 05/04/17 2334 05/05/17 0652  WBC 11.5*  --   NEUTROABS 9.7*  --   HGB 13.3 11.2*  HCT 38.6* 33.0*  MCV 93.7  --   PLT  154  --     Signed:  Vassie Loll MD.  [facility] 05/06/2017, 3:06 PM

## 2017-05-06 NOTE — Progress Notes (Signed)
OT Cancellation Note  Patient Details Name: Tally JoeLuther W Luis MRN: [id number] DOB: 04-11-1956   Cancelled Treatment:    Reason Eval/Treat Not Completed: PT screened, no needs identified, will sign off  Aireona Torelli 05/06/2017, 12:15 PM  Marica OtterMaryellen Salem Mastrogiovanni, OTR/L [phone] 05/06/2017

## 2017-05-06 NOTE — Evaluation (Signed)
Physical Therapy Evaluation Patient Details Name: Timothy Odonnell MRN: [id number] DOB: 22-Dec-1955 Today's Date: 05/06/2017   History of Present Illness  61 y.o. male with medical history significant of hypertension, arthritis, s/p R TKA, acid reflux, hyperlipidemia and possibly mental retardation presented to the ER after patient was exposed to the cold associated with hypotension.  Patient reported he was drinking beer and decided to go for a walk in the cold.  He felt tired.  He called EMS to take him home when he was found to be hypotensive. Dx of lactic acidosis.   Clinical Impression  Pt is independent with mobility, he ambulated 280' without an assistive device, no loss of balance. He reports chronic L knee pain, noted imaging showed arthritis in L knee. He has an antalgic gait pattern and reports he uses a cane as needed. Recommended pt follow up with orthopedic MD. From PT standpoint, he is ready to DC home, no follow up PT indicated. PT signing off.      Follow Up Recommendations No PT follow up    Equipment Recommendations  None recommended by PT    Recommendations for Other Services       Precautions / Restrictions Precautions Precautions: Fall Precaution Comments: pt reports he's had some falls in past year but is vague historian Restrictions Weight Bearing Restrictions: No      Mobility  Bed Mobility Overal bed mobility: Independent                Transfers Overall transfer level: Independent                  Ambulation/Gait Ambulation/Gait assistance: Independent Ambulation Distance (Feet): 280 Feet Assistive device: None Gait Pattern/deviations: Decreased weight shift to left;Antalgic   Gait velocity interpretation: at or above normal speed for age/gender General Gait Details: decreased knee flexion LLE, no LOB  Stairs            Wheelchair Mobility    Modified Rankin (Stroke Patients Only)       Balance Overall balance  assessment: Independent                                           Pertinent Vitals/Pain Pain Assessment: 0-10 Pain Score: 8  Pain Location: L knee with walking Pain Descriptors / Indicators: Sore Pain Intervention(s): Limited activity within patient's tolerance;Monitored during session;Patient requesting pain meds-RN notified    Home Living Family/patient expects to be discharged to:: Private residence Living Arrangements: Alone   Type of Home: House Home Access: Ramped entrance     Home Layout: One level Home Equipment: Cane - single point;Tub bench;Crutches      Prior Function Level of Independence: Independent with assistive device(s)         Comments: uses cane when his L knee is stiff     Hand Dominance        Extremity/Trunk Assessment   Upper Extremity Assessment Upper Extremity Assessment: Overall WFL for tasks assessed    Lower Extremity Assessment Lower Extremity Assessment: Overall WFL for tasks assessed    Cervical / Trunk Assessment Cervical / Trunk Assessment: Normal  Communication   Communication: No difficulties  Cognition Arousal/Alertness: Awake/alert Behavior During Therapy: WFL for tasks assessed/performed Overall Cognitive Status: Within Functional Limits for tasks assessed  General Comments      Exercises     Assessment/Plan    PT Assessment Patent does not need any further PT services  PT Problem List         PT Treatment Interventions      PT Goals (Current goals can be found in the Care Plan section)  Acute Rehab PT Goals PT Goal Formulation: All assessment and education complete, DC therapy    Frequency     Barriers to discharge        Co-evaluation               AM-PAC PT "6 Clicks" Daily Activity  Outcome Measure Difficulty turning over in bed (including adjusting bedclothes, sheets and blankets)?: None Difficulty moving from  lying on back to sitting on the side of the bed? : None Difficulty sitting down on and standing up from a chair with arms (e.g., wheelchair, bedside commode, etc,.)?: None Help needed moving to and from a bed to chair (including a wheelchair)?: None Help needed walking in hospital room?: None Help needed climbing 3-5 steps with a railing? : None 6 Click Score: 24    End of Session Equipment Utilized During Treatment: Gait belt Activity Tolerance: Patient tolerated treatment well Patient left: in bed;with call bell/phone within reach Nurse Communication: Mobility status;Patient requests pain meds      Time: 1008-1017 PT Time Calculation (min) (ACUTE ONLY): 9 min   Charges:   PT Evaluation $PT Eval Low Complexity: 1 Low     PT G Codes:   PT G-Codes **NOT FOR INPATIENT CLASS** Functional Assessment Tool Used: AM-PAC 6 Clicks Basic Mobility Functional Limitation: Mobility: Walking and moving around Mobility: Walking and Moving Around Current Status (Z6109(G8978): 0 percent impaired, limited or restricted Mobility: Walking and Moving Around Goal Status (U0454(G8979): 0 percent impaired, limited or restricted      Tamala SerUhlenberg, Annalaura Sauseda Kistler 05/06/2017, 10:24 AM [phone]

## 2017-05-06 NOTE — Progress Notes (Signed)
Patient has discharged to home; discharge instruction including medication was performed by Rn to patient; patient has no question at this time.

## 2017-10-30 ENCOUNTER — Emergency Department (HOSPITAL_COMMUNITY)
Admission: EM | Admit: 2017-10-30 | Discharge: 2017-10-30 | Disposition: A | Discharge status: Home / Self Care | Payer: Medicaid Other | Attending: Emergency Medicine | Admitting: Emergency Medicine

## 2017-10-30 ENCOUNTER — Emergency Department (HOSPITAL_COMMUNITY): Payer: Medicaid Other

## 2017-10-30 ENCOUNTER — Other Ambulatory Visit: Payer: Self-pay

## 2017-10-30 ENCOUNTER — Encounter (HOSPITAL_COMMUNITY): Payer: Self-pay

## 2017-10-30 DIAGNOSIS — F17200 Nicotine dependence, unspecified, uncomplicated: Secondary | ICD-10-CM | POA: Insufficient documentation

## 2017-10-30 DIAGNOSIS — I10 Essential (primary) hypertension: Secondary | ICD-10-CM | POA: Insufficient documentation

## 2017-10-30 DIAGNOSIS — X31XXXA Exposure to excessive natural cold, initial encounter: Secondary | ICD-10-CM | POA: Diagnosis not present

## 2017-10-30 DIAGNOSIS — F10929 Alcohol use, unspecified with intoxication, unspecified: Secondary | ICD-10-CM

## 2017-10-30 DIAGNOSIS — R001 Bradycardia, unspecified: Secondary | ICD-10-CM | POA: Diagnosis not present

## 2017-10-30 DIAGNOSIS — T68XXXA Hypothermia, initial encounter: Secondary | ICD-10-CM | POA: Diagnosis not present

## 2017-10-30 DIAGNOSIS — F10920 Alcohol use, unspecified with intoxication, uncomplicated: Secondary | ICD-10-CM | POA: Diagnosis present

## 2017-10-30 LAB — HEPATIC FUNCTION PANEL
ALT: 33 U/L (ref 17–63)
AST: 80 U/L — ABNORMAL HIGH (ref 15–41)
Albumin: 3.9 g/dL (ref 3.5–5.0)
Alkaline Phosphatase: 80 U/L (ref 38–126)
BILIRUBIN INDIRECT: 0.1 mg/dL — AB (ref 0.3–0.9)
Bilirubin, Direct: 0.2 mg/dL (ref 0.1–0.5)
TOTAL PROTEIN: 8.1 g/dL (ref 6.5–8.1)
Total Bilirubin: 0.3 mg/dL (ref 0.3–1.2)

## 2017-10-30 LAB — MAGNESIUM: MAGNESIUM: 1.8 mg/dL (ref 1.7–2.4)

## 2017-10-30 LAB — I-STAT CHEM 8, ED
BUN: 9 mg/dL (ref 6–20)
CALCIUM ION: 1.1 mmol/L — AB (ref 1.15–1.40)
Chloride: 105 mmol/L (ref 101–111)
Creatinine, Ser: 1.2 mg/dL (ref 0.61–1.24)
GLUCOSE: 79 mg/dL (ref 65–99)
HCT: 40 % (ref 39.0–52.0)
HEMOGLOBIN: 13.6 g/dL (ref 13.0–17.0)
POTASSIUM: 3.8 mmol/L (ref 3.5–5.1)
Sodium: 144 mmol/L (ref 135–145)
TCO2: 24 mmol/L (ref 22–32)

## 2017-10-30 LAB — ETHANOL: Alcohol, Ethyl (B): 404 mg/dL (ref <10)

## 2017-10-30 LAB — TSH: TSH: 1.961 u[IU]/mL (ref 0.350–4.500)

## 2017-10-30 LAB — PHOSPHORUS: PHOSPHORUS: 4.1 mg/dL (ref 2.5–4.6)

## 2017-10-30 MED ORDER — SODIUM CHLORIDE 0.9 % IV BOLUS
1000.0000 mL | Freq: Once | INTRAVENOUS | Status: AC
  Administered 2017-10-30: 1000 mL via INTRAVENOUS

## 2017-10-30 MED ORDER — SODIUM CHLORIDE 0.9 % IV SOLN
Freq: Once | INTRAVENOUS | Status: AC
  Administered 2017-10-30: 19:00:00 via INTRAVENOUS

## 2017-10-30 NOTE — ED Notes (Signed)
Pt discharged without his shoes, says he had red and black [employer] sneakers on arrival
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
## 2017-10-30 NOTE — ED Provider Notes (Signed)
Blood pressure 91/60, pulse (!) 50, resp. rate 16, height 6\' 2"  (1.88 m), weight 88.5 kg (195 lb), SpO2 92 %.  Assuming care from Dr. Patria Maneampos.  In short, Timothy Odonnell is a 62 y.o. male with a chief complaint of Alcohol Intoxication .  Refer to the original H&P for additional details.  The current plan of care is to follow up patient sobering. Adding TSH and left knee x-ray. Patient awakens easily and HR increases but then drops if asleep.  07:55 PM Patient's temperature back to normal.  Patient is feeling much better and is anxious to leave.  His bradycardia is improving.  He has ambulated in the emergency department without lightheadedness or other symptoms.  He is eating and drinking without difficulty.  Plan for finishing IV fluids here in the discharge with outpatient PCP and cardiology follow-up. TSH and knee x-ray negative.   Alona BeneJoshua Clarion Mooneyhan, MD   Maia PlanLong, D'Arcy Abraha G, MD 10/30/17 2001

## 2017-10-30 NOTE — ED Notes (Signed)
Pt ambulated down hallway 10 feet and back no problems. HR jumped up to 77 99% RA the whole time.

## 2017-10-30 NOTE — ED Notes (Signed)
Pt states he is ready to go home

## 2017-10-30 NOTE — Discharge Instructions (Signed)
You were seen in the ED today with alcohol intoxication. Your heart rate was low and we would like for you to follow up with your PCP and have sent a referral to see a Cardiologist. Return to the ED with any new or worsening symptoms.   Substance Abuse Treatment Programs  Intensive Outpatient Programs [facility]     [address]      [place], [place]                   [phone]       [facility] [address] [place], [place] [phone]  [facility]     (Inpatient and outpatient)     [address]           [phone]    [facility] [phone] (Suboxone and Methadone)  [address]      [place], [place] [address]      [phone]       [address] [place], [place] [phone]  [facility] (Outpatient/Inpatient, [employer])    (insurance only) [phone]             [facility] (Groups & Residential) [place], [place] [phone]     [facility]     [address]     [place], [place]      [phone]       [facility] (for caregivers and family) [phone] [address] [place], [place] [phone]      Residential Treatment Programs [facility]      [address], [place], [place] [address]  [phone]       [facility] [address] [place], [place] [address] [phone]  [facility]        [phone]       [facility] [phone]  [facility] ([facility]             [address]                                         [place], [place]                                                [phone] or [phone]                               [facility] [place] [address] [place], [address] [phone]  [facility]    [address]      [place], [place]     [phone]       [facility] [address] [place],  [place] [phone]  [facility]   [address]     [place], [place] [address]     [phone]      Admissions: 8am-3pm M-F  [facility] ([facility]) [address] [place], [place] [phone]  [facility]: Residential Program [phone](90 Days)   [place], [place]      [phone] or [phone]     [facility]: [facility] [place], [place] (Walk in Hours over the weekend  or by referral)  [facility] [address] [place], [place], [place] [address] [phone]  Crisis Mobile: [facility]:  [phone] (for crisis response 24 hours a day) [facility] Hotline:      [phone] Outpatient Psychiatry and Counseling  [facility]: Mobile Crisis Management 24 hours:  [phone]  [facility] of the [employer] sliding scale fee and walk in schedule: M-F 8am-12pm/1pm-3pm [address]  [place], [place] [address] [phone]  [facility] [address] [place], [place] [address] [phone]  [facility] (Formerly known as [facility] [employer])- new patient walk-in appointments available Monday - Friday 8am -3pm.          [address] [place], [place] [address] [phone] or crisis line- [phone]  [facility] Services/ Intensive Outpatient Therapy Program [address] [place], [place] [address] [phone]  [facility]                  Crisis Services      [phone] [address]     [place], [place] [address]                 [facility]   [facility] [phone][address] [place], [place] [address]   [employer] of Care          [address]  [place], [place] [address]       [phone]  [facility] [address] [place], [place] [address] [phone]  [facility]    [address]    [place], [place] [address]     [phone]       Andee Poles, MD     [address]     [place] [address]     [phone]       [facility] [address] [place] [address]  [facility]     [address] [place], [place]      [phone]       [facility] Eulogio Ditch, MD [address] [place], [place] [address] [phone]  [facility]     [address]     [place], [place] [address]     [phone]       [facility] [address] [place], [place] [address] [phone] Resources for Temporary Residential Assistance/Crisis Centers  [facility] CENT[facility]) M-F 8am-3pm   [address] [place], [place] [address]   [phone] Services include: laundry, barbering, support groups, case management, phone  & computer access, showers, AA/NA mtgs, mental health/substance abuse nurse, job skills class, disability information, VA assistance, spiritual classes, etc.   HOMELESS SHELTERS  [facility]     [facility]   [address], [place]     [phone]              [employer] (women and children)       [address] [place], [place] [address] [phone] [email or web address] for application and process Application Required  [facility]   [address]    [place] [address]     [phone]  [place], [place] [address] [phone](OBSJGGEZ application appt.) Application Required  [facility] (women only)    [address] [place] [address]     [place], [place] [address]     [phone]      Intake starts 6pm daily Need valid ID, SSC, & Police report [employer] [address] [place], [place] [phone] Application  Required  [employer] (men only)     [place]      [place], [place]       [place] (Pregnant women only) [address] [place], [place]  [facility]      [address]      [place], [place] [address]     [phone]             [facility] [address] [place], [place] 90 day commitment/SA/Application process  [facility](men only)     [address]     [place], [place]       Check-in at [facility] [address] [place], [place] [address] [phone] Men/Women/Women and Children must be there by 7 pm  [place], [place]

## 2017-10-30 NOTE — ED Provider Notes (Signed)
[name] Provider Note   CSN: [id number] Arrival date & time: 10/30/17  1025     History   Chief Complaint Chief Complaint  Patient presents with  . Alcohol Intoxication    HPI Timothy Odonnell is a 62 y.o. male.  HPI Patient is a 62 year old male who was found laying in the parking lot intoxicated.  Patient brought to the emergency department for further evaluation.  Patient admits to drinking large volume of alcohol the day.  He was found to be hypothermic on arrival to the emergency department.  No chest pain or shortness of breath.  No head injury or headache at this time.  Denies weakness of his arms or legs.  States he feels okay just wants to sleep.   Past Medical History:  Diagnosis Date  . Arthritis   . Essential hypertension   . GERD (gastroesophageal reflux disease)   . Hyperlipidemia   . Mental retardation     Patient Active Problem List   Diagnosis Date Noted  . Alcohol use   . Dehydration   . Lactic acidosis 05/05/2017  . Knee pain   . Alcohol withdrawal syndrome without complication (HCC)   . Primary localized osteoarthritis of right knee 12/20/2014  . BARRETTS ESOPHAGUS 02/02/2010  . ALCOHOL ABUSE 01/22/2010  . ALCOHOLIC HEPATITIS 01/06/2010  . LOSS OF WEIGHT 11/25/2009  . FATTY LIVER DISEASE 10/27/2009  . VITAMIN D DEFICIENCY 09/29/2009  . ELECTROCARDIOGRAM, ABNORMAL 09/26/2009  . LIVER FUNCTION TESTS, ABNORMAL, HX OF 09/19/2009  . PSYCHIATRIC DISORDER 08/26/2009  . FATIGUE 10/23/2008  . HYPERLIPIDEMIA 11/24/2006  . HYPERTENSION 11/24/2006  . GERD 11/24/2006  . OSTEOARTHRITIS 11/24/2006  . RETARDATION, MENTAL, MILD 11/07/2006    Past Surgical History:  Procedure Laterality Date  . 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 Authorizing Provider  chlordiazePOXIDE (LIBRIUM) 10 MG capsule 1 tablet by mouth three times a day for 2 days; then 1 tablet by mouth twice a day for 3 days; then 1 tablet by mouth daily for 3 days; then 1/2 tablet by mouth daily for 3 days and stop medication. Patient not taking: Reported on 10/30/2017 05/06/17   Vassie Loll, MD  folic acid (FOLVITE) 1 MG tablet Take 1 tablet (1 mg total) by mouth daily. Patient not taking: Reported on 10/30/2017 05/07/17   Vassie Loll, MD  Multiple Vitamin (MULTIVITAMIN WITH MINERALS) TABS tablet Take 1 tablet by mouth daily. Patient not taking: Reported on 10/30/2017 05/07/17   Vassie Loll, MD  thiamine 100 MG tablet Take 1 tablet (100 mg total) by mouth daily. Patient not taking: Reported on 10/30/2017 05/07/17   Vassie Loll, MD    Family History Family History  Problem Relation Age of Onset  . Diabetes Mother   . Hypertension Mother   . Emphysema Father   . Hypertension Brother   . Stroke Neg Hx     Social History Social History   Tobacco Use  . Smoking status: Current Some Day Smoker  . Smokeless tobacco: Never Used  Substance Use Topics  . Alcohol use: Yes    Comment: Daily.   . Drug use: Yes    Types: Benzodiazepines     Allergies   Sulfamethoxazole-trimethoprim  Review of Systems Review of Systems  All other systems reviewed and are negative.    Physical Exam Updated Vital Signs BP (!) 107/57   Pulse 71   Resp 12   Ht 6\' 2"  (1.88 m)   Wt 88.5 kg (195 lb)   SpO2 91%   BMI 25.04 kg/m   Physical Exam  Constitutional: He is oriented to person, place, and time. He appears well-developed and well-nourished.  HENT:  Head: Normocephalic and atraumatic.  Eyes: EOM are normal.  Neck: Normal range of motion.  Cardiovascular: Normal rate, regular rhythm, normal heart sounds and intact distal pulses.  Pulmonary/Chest: Effort normal and breath sounds normal. No  respiratory distress.  Abdominal: Soft. He exhibits no distension. There is no tenderness.  Musculoskeletal: Normal range of motion.  Neurological: He is alert and oriented to person, place, and time.  Skin: Skin is warm and dry.  Psychiatric: He has a normal mood and affect. Judgment normal.  Nursing note and vitals reviewed.    ED Treatments / Results  Labs (all labs ordered are listed, but only abnormal results are displayed) Labs Reviewed  ETHANOL - Abnormal; Notable for the following components:      Result Value   Alcohol, Ethyl (B) 404 (*)    All other components within normal limits  HEPATIC FUNCTION PANEL - Abnormal; Notable for the following components:   AST 80 (*)    Indirect Bilirubin 0.1 (*)    All other components within normal limits  I-STAT CHEM 8, ED - Abnormal; Notable for the following components:   Calcium, Ion 1.10 (*)    All other components within normal limits  MAGNESIUM  PHOSPHORUS    EKG None  Radiology No results found.  Procedures Procedures (including critical care time)  Medications Ordered in ED Medications  sodium chloride 0.9 % bolus 1,000 mL (0 mLs Intravenous Stopped 10/30/17 1417)     Initial Impression / Assessment and Plan / ED Course  I have reviewed the triage vital signs and the nursing notes.  Pertinent labs & imaging results that were available during my care of the patient were reviewed by me and considered in my medical decision making (see chart for details).     Patient is developed some bradycardia here in the emergency department.  He has no hypotension associated with it or syncope or near syncope.  We will continue to monitor closely.  I think the majority of this is likely secondary to his alcohol level of 404 and his hypothermia to 93.  Warming measures in place.  I suspect as he sobers and as his core temperature warms his bradycardia will resolve.  I suspect his hypothermia is secondary to exposure  4:48  PM Care to Dr Jacqulyn BathLong   Final Clinical Impressions(s) / ED Diagnoses   Final diagnoses:  None    ED Discharge Orders    None       Azalia Bilisampos, Ciarah Peace, MD 10/30/17 1649

## 2017-10-30 NOTE — ED Notes (Signed)
[facility] Expected date:  Expected time:  Means of arrival:  Comments: Do Not Use

## 2017-10-30 NOTE — ED Notes (Signed)
Pt found to be brady from 29-40 bpm. Notified Dr Patria Maneampos and placed pads on pt to Zoll. Pt inebriated but wakes easily when touched or spoken to. Dr Patria Maneampos at bedside.

## 2018-01-12 ENCOUNTER — Emergency Department (HOSPITAL_COMMUNITY): Payer: Medicaid Other

## 2018-01-12 ENCOUNTER — Emergency Department (HOSPITAL_COMMUNITY)
Admission: EM | Admit: 2018-01-12 | Discharge: 2018-01-12 | Disposition: A | Discharge status: Home / Self Care | Payer: Medicaid Other | Attending: Emergency Medicine | Admitting: Emergency Medicine

## 2018-01-12 ENCOUNTER — Other Ambulatory Visit: Payer: Self-pay

## 2018-01-12 ENCOUNTER — Encounter (HOSPITAL_COMMUNITY): Payer: Self-pay

## 2018-01-12 DIAGNOSIS — Z96651 Presence of right artificial knee joint: Secondary | ICD-10-CM | POA: Insufficient documentation

## 2018-01-12 DIAGNOSIS — Z23 Encounter for immunization: Secondary | ICD-10-CM | POA: Insufficient documentation

## 2018-01-12 DIAGNOSIS — Z79899 Other long term (current) drug therapy: Secondary | ICD-10-CM | POA: Diagnosis not present

## 2018-01-12 DIAGNOSIS — S61211A Laceration without foreign body of left index finger without damage to nail, initial encounter: Secondary | ICD-10-CM | POA: Diagnosis not present

## 2018-01-12 DIAGNOSIS — S63259A Unspecified dislocation of unspecified finger, initial encounter: Secondary | ICD-10-CM

## 2018-01-12 DIAGNOSIS — Y929 Unspecified place or not applicable: Secondary | ICD-10-CM | POA: Insufficient documentation

## 2018-01-12 DIAGNOSIS — F79 Unspecified intellectual disabilities: Secondary | ICD-10-CM | POA: Diagnosis not present

## 2018-01-12 DIAGNOSIS — Y9301 Activity, walking, marching and hiking: Secondary | ICD-10-CM | POA: Insufficient documentation

## 2018-01-12 DIAGNOSIS — R51 Headache: Secondary | ICD-10-CM | POA: Insufficient documentation

## 2018-01-12 DIAGNOSIS — I10 Essential (primary) hypertension: Secondary | ICD-10-CM | POA: Insufficient documentation

## 2018-01-12 DIAGNOSIS — W0110XA Fall on same level from slipping, tripping and stumbling with subsequent striking against unspecified object, initial encounter: Secondary | ICD-10-CM | POA: Insufficient documentation

## 2018-01-12 DIAGNOSIS — F17200 Nicotine dependence, unspecified, uncomplicated: Secondary | ICD-10-CM | POA: Diagnosis not present

## 2018-01-12 DIAGNOSIS — Y999 Unspecified external cause status: Secondary | ICD-10-CM | POA: Insufficient documentation

## 2018-01-12 DIAGNOSIS — R52 Pain, unspecified: Secondary | ICD-10-CM

## 2018-01-12 DIAGNOSIS — S6992XA Unspecified injury of left wrist, hand and finger(s), initial encounter: Secondary | ICD-10-CM | POA: Diagnosis present

## 2018-01-12 DIAGNOSIS — S63251A Unspecified dislocation of left index finger, initial encounter: Secondary | ICD-10-CM | POA: Insufficient documentation

## 2018-01-12 DIAGNOSIS — S61219A Laceration without foreign body of unspecified finger without damage to nail, initial encounter: Secondary | ICD-10-CM

## 2018-01-12 MED ORDER — CEFAZOLIN SODIUM-DEXTROSE 1-4 GM/50ML-% IV SOLN
1.0000 g | Freq: Once | INTRAVENOUS | Status: AC
  Administered 2018-01-12: 1 g via INTRAVENOUS
  Filled 2018-01-12: qty 50

## 2018-01-12 MED ORDER — ACETAMINOPHEN 325 MG PO TABS
650.0000 mg | ORAL_TABLET | Freq: Once | ORAL | Status: AC
  Administered 2018-01-12: 650 mg via ORAL
  Filled 2018-01-12: qty 2

## 2018-01-12 MED ORDER — CEPHALEXIN 500 MG PO CAPS: 500.0000 mg | ORAL_CAPSULE | Freq: Four times a day (QID) | ORAL | 0 refills | Status: DC

## 2018-01-12 MED ORDER — BACITRACIN ZINC 500 UNIT/GM EX OINT
TOPICAL_OINTMENT | CUTANEOUS | Status: AC
  Administered 2018-01-12: 08:00:00
  Filled 2018-01-12: qty 1.8

## 2018-01-12 MED ORDER — BUPIVACAINE HCL (PF) 0.5 % IJ SOLN
10.0000 mL | Freq: Once | INTRAMUSCULAR | Status: AC
  Administered 2018-01-12: 10 mL
  Filled 2018-01-12: qty 30

## 2018-01-12 MED ORDER — TETANUS-DIPHTH-ACELL PERTUSSIS 5-2.5-18.5 LF-MCG/0.5 IM SUSP
0.5000 mL | Freq: Once | INTRAMUSCULAR | Status: AC
  Administered 2018-01-12: 0.5 mL via INTRAMUSCULAR
  Filled 2018-01-12: qty 0.5

## 2018-01-12 NOTE — ED Notes (Signed)
Patient transported to X-ray 

## 2018-01-12 NOTE — ED Triage Notes (Signed)
Per ems: pt found on the side of the street c/o laceration on point and middle finger on left hand. Pt stated he "was walking on the sidewalk and lost balance and grabbed onto street signed which cut my hand". No blood thinner. Bleeding under control currently. Reported drinking unknown amount today because "it was his birthday."

## 2018-01-12 NOTE — ED Notes (Signed)
Bed: [id number] Expected date:  Expected time:  Means of arrival:  Comments: 6364 laceration/ETOH

## 2018-01-12 NOTE — Discharge Instructions (Signed)
Please keep the splint applied.  Make sure that you clean and dress her wounds daily.  Watch for signs of infection.  Take antibiotics as prescribed.  You need to follow-up with a hand surgeon.  Is very important that you call this week to schedule an appointment for early next week.  Return the ED with any worsening pain or decreased capillary refill as discussed.

## 2018-01-13 NOTE — ED Provider Notes (Signed)
[facility]-EMERGENCY DEPT Provider Note   CSN: [id number] Arrival date & time: 01/12/18  0441     History   Chief Complaint Chief Complaint  Patient presents with  . Extremity Laceration    HPI Timothy Odonnell is a 62 y.o. male.  HPI 62 year old male past medical history significant for hypertension, arthritis, mental retardation, alcohol abuse presents to the emergency department today for tox occasion, fall and laceration to his left fingers.  Patient states that he was drinking this afternoon.  States he was walking and his left knee buckled on him causing him to fall.  Patient states that he grabbed the road sign causing lacerations to his index and middle finger.  The patient reports limited range of motion of the index finger secondary to pain.  He denies hitting his head or losing consciousness.  Patient does not take any blood thinners.  Patient states he was drinking an unknown amount of alcohol today because "it was his birthday".  History of same.  Patient is unsure of his last tetanus shot.  Denies any other associated pain or injuries at this time.  Headache, vision changes, lightheadedness, dizziness, abdominal pain, paresthesias, weakness. Past Medical History:  Diagnosis Date  . Arthritis   . Essential hypertension   . GERD (gastroesophageal reflux disease)   . Hyperlipidemia   . Mental retardation     Patient Active Problem List   Diagnosis Date Noted  . Alcohol use   . Dehydration   . Lactic acidosis 05/05/2017  . Knee pain   . Alcohol withdrawal syndrome without complication (HCC)   . Primary localized osteoarthritis of right knee 12/20/2014  . BARRETTS ESOPHAGUS 02/02/2010  . ALCOHOL ABUSE 01/22/2010  . ALCOHOLIC HEPATITIS 01/06/2010  . LOSS OF WEIGHT 11/25/2009  . FATTY LIVER DISEASE 10/27/2009  . VITAMIN D DEFICIENCY 09/29/2009  . ELECTROCARDIOGRAM, ABNORMAL 09/26/2009  . LIVER FUNCTION TESTS, ABNORMAL, HX OF 09/19/2009  .  PSYCHIATRIC DISORDER 08/26/2009  . FATIGUE 10/23/2008  . HYPERLIPIDEMIA 11/24/2006  . HYPERTENSION 11/24/2006  . GERD 11/24/2006  . OSTEOARTHRITIS 11/24/2006  . RETARDATION, MENTAL, MILD 11/07/2006    Past Surgical History:  Procedure Laterality Date  . TOTAL KNEE ARTHROPLASTY Right 12/20/2014   Procedure: RIGHT TOTAL KNEE ARTHROPLASTY;  Surgeon: Frederico Hamman, MD;  Location: [facility] OR;  Service: Orthopedics;  Laterality: Right;        Home Medications    Prior to Admission medications   Medication Sig Start Date End Date Taking? Authorizing Provider  cephALEXin (KEFLEX) 500 MG capsule Take 1 capsule (500 mg total) by mouth 4 (four) times daily. 01/12/18   Rise Mu, PA-C  chlordiazePOXIDE (LIBRIUM) 10 MG capsule 1 tablet by mouth three times a day for 2 days; then 1 tablet by mouth twice a day for 3 days; then 1 tablet by mouth daily for 3 days; then 1/2 tablet by mouth daily for 3 days and stop medication. Patient not taking: Reported on 01/12/2018 05/06/17   Vassie Loll, MD  folic acid (FOLVITE) 1 MG tablet Take 1 tablet (1 mg total) by mouth daily. Patient not taking: Reported on 10/30/2017 05/07/17   Vassie Loll, MD  Multiple Vitamin (MULTIVITAMIN WITH MINERALS) TABS tablet Take 1 tablet by mouth daily. Patient not taking: Reported on 10/30/2017 05/07/17   Vassie Loll, MD  thiamine 100 MG tablet Take 1 tablet (100 mg total) by mouth daily. Patient not taking: Reported on 10/30/2017 05/07/17   Vassie Loll, MD    Family  History Family History  Problem Relation Age of Onset  . Diabetes Mother   . Hypertension Mother   . Emphysema Father   . Hypertension Brother   . Stroke Neg Hx     Social History Social History   Tobacco Use  . Smoking status: Current Some Day Smoker  . Smokeless tobacco: Never Used  Substance Use Topics  . Alcohol use: Yes    Comment: Daily.   . Drug use: Yes    Types: Benzodiazepines     Allergies     Sulfamethoxazole-trimethoprim   Review of Systems Review of Systems  All other systems reviewed and are negative.    Physical Exam Updated Vital Signs BP 120/71   Pulse (!) 52   Temp 98.3 F (36.8 C) (Oral)   Resp 16   Ht 6\' 2"  (1.88 m)   Wt 90.7 kg   SpO2 100%   BMI 25.68 kg/m   Physical Exam Physical Exam  Constitutional: Pt is oriented to person, place, and time.  Patient smells of alcohol and appears to be intoxicated.  Appears well-developed and well-nourished. No distress.  HENT:  Head: Normocephalic and atraumatic.  No battle sign or raccoon eyes. Ears: No bilateral hemotympanum. Nose: Nose normal. No septal hematoma. Mouth/Throat: Uvula is midline, oropharynx is clear and moist and mucous membranes are normal.  Eyes: Conjunctivae and EOM are normal. Pupils are equal, round, and reactive to light.  Neck: No spinous process tenderness and no muscular tenderness present. No rigidity. Normal range of motion present.  Full ROM without pain No midline cervical tenderness No crepitus, deformity or step-offs No paraspinal tenderness  Cardiovascular: Normal rate, regular rhythm and intact distal pulses.   Pulses:      Radial pulses are 2+ on the right side, and 2+ on the left side.       Dorsalis pedis pulses are 2+ on the right side, and 2+ on the left side.       Posterior tibial pulses are 2+ on the right side, and 2+ on the left side.  Pulmonary/Chest: Effort normal and breath sounds normal. No accessory muscle usage. No respiratory distress. No decreased breath sounds. No wheezes. No rhonchi. No rales. Exhibits no tenderness and no bony tenderness.  No flail segment, crepitus or deformity Equal chest expansion  Abdominal: Soft. Normal appearance and bowel sounds are normal. There is no tenderness. There is no rigidity, no guarding and no CVA tenderness.  Abd soft and nontender  Musculoskeletal: Normal range of motion.       Thoracic back: Exhibits normal range of  motion.       Lumbar back: Exhibits normal range of motion.  Full range of motion of the T-spine and L-spine No tenderness to palpation of the spinous processes of the T-spine or L-spine No crepitus, deformity or step-offs No tenderness to palpation of the paraspinous muscles of the L-spine  Obvious deformity to the left index finger over the PIP.  Patient has limited range of motion.  Cap refill is present.  Sensation intact.  Full range of motion of the left middle finger with laceration noted. Lymphadenopathy:    Pt has no cervical adenopathy.  Neurological: Pt is alert and oriented to person, place, and time. Normal reflexes. No cranial nerve deficit. GCS eye subscore is 4. GCS verbal subscore is 5. GCS motor subscore is 6.  Reflex Scores:      Bicep reflexes are 2+ on the right side and 2+ on the left side.  Brachioradialis reflexes are 2+ on the right side and 2+ on the left side.      Patellar reflexes are 2+ on the right side and 2+ on the left side.      Achilles reflexes are 2+ on the right side and 2+ on the left side. Speech is slurred and goal oriented, follows commands Normal 5/5 strength in upper and lower extremities bilaterally including dorsiflexion and plantar flexion, strong and equal grip strength Sensation normal to light and sharp touch Moves extremities without ataxia, coordination intact No Clonus  Skin: Skin is warm and dry. No rash noted. Pt is not diaphoretic. No erythema. Patient has 2 lacerations noted at the base of the index and middle finger on the palmar aspect of the left hand.  These are just proximal to the PIP.  Bleeding is controlled at this time.  There is tendon exposure to the index finger laceration.  There also appears to be an obvious deformity likely secondary to a dislocation.  No significant debris noted. Psychiatric: Normal mood and affect.  Nursing note and vitals reviewed.         ED Treatments / Results  Labs (all labs ordered  are listed, but only abnormal results are displayed) Labs Reviewed - No data to display  EKG None  Radiology Ct Head Wo Contrast  Result Date: 01/12/2018 CLINICAL DATA:  Fall striking head on sidewalk. No known loss of consciousness. EXAM: CT HEAD WITHOUT CONTRAST CT CERVICAL SPINE WITHOUT CONTRAST TECHNIQUE: Multidetector CT imaging of the head and cervical spine was performed following the standard protocol without intravenous contrast. Multiplanar CT image reconstructions of the cervical spine were also generated. COMPARISON:  Head CT 02/25/2017 FINDINGS: CT HEAD FINDINGS Brain: Generalized atrophy, stable from prior but age advanced. No intracranial hemorrhage, mass effect, or midline shift. No hydrocephalus. The basilar cisterns are patent. No evidence of territorial infarct or acute ischemia. No extra-axial or intracranial fluid collection. Vascular: Atherosclerosis of skullbase vasculature without hyperdense vessel or abnormal calcification. Skull: No fracture or focal lesion. Sinuses/Orbits: Paranasal sinuses and mastoid air cells are clear. The visualized orbits are unremarkable. Other: None. CT CERVICAL SPINE FINDINGS Alignment: Straightening of normal lordosis. No traumatic subluxation. Skull base and vertebrae: No acute fracture. Vertebral body heights are maintained. The dens and skull base are intact. Scattered vertebral body lucency is felt to be degenerative in etiology. Soft tissues and spinal canal: No prevertebral fluid or swelling. No visible canal hematoma. Disc levels: Multilevel degenerative change. Diffuse disc space narrowing and endplate spurring throughout the entire cervical spine. Near complete disc space loss at C4-C5 with partial bony fusion. Multilevel facet arthropathy. Upper chest: No acute abnormality. Other: None. IMPRESSION: 1. No acute intracranial abnormality. No skull fracture. Unchanged atrophy. 2. Advanced degenerative change in the cervical spine without acute  fracture or subluxation. Electronically Signed   By: Rubye Oaks M.D.   On: 01/12/2018 06:06   Ct Cervical Spine Wo Contrast  Result Date: 01/12/2018 CLINICAL DATA:  Fall striking head on sidewalk. No known loss of consciousness. EXAM: CT HEAD WITHOUT CONTRAST CT CERVICAL SPINE WITHOUT CONTRAST TECHNIQUE: Multidetector CT imaging of the head and cervical spine was performed following the standard protocol without intravenous contrast. Multiplanar CT image reconstructions of the cervical spine were also generated. COMPARISON:  Head CT 02/25/2017 FINDINGS: CT HEAD FINDINGS Brain: Generalized atrophy, stable from prior but age advanced. No intracranial hemorrhage, mass effect, or midline shift. No hydrocephalus. The basilar cisterns are patent. No evidence of territorial  infarct or acute ischemia. No extra-axial or intracranial fluid collection. Vascular: Atherosclerosis of skullbase vasculature without hyperdense vessel or abnormal calcification. Skull: No fracture or focal lesion. Sinuses/Orbits: Paranasal sinuses and mastoid air cells are clear. The visualized orbits are unremarkable. Other: None. CT CERVICAL SPINE FINDINGS Alignment: Straightening of normal lordosis. No traumatic subluxation. Skull base and vertebrae: No acute fracture. Vertebral body heights are maintained. The dens and skull base are intact. Scattered vertebral body lucency is felt to be degenerative in etiology. Soft tissues and spinal canal: No prevertebral fluid or swelling. No visible canal hematoma. Disc levels: Multilevel degenerative change. Diffuse disc space narrowing and endplate spurring throughout the entire cervical spine. Near complete disc space loss at C4-C5 with partial bony fusion. Multilevel facet arthropathy. Upper chest: No acute abnormality. Other: None. IMPRESSION: 1. No acute intracranial abnormality. No skull fracture. Unchanged atrophy. 2. Advanced degenerative change in the cervical spine without acute fracture  or subluxation. Electronically Signed   By: Rubye Oaks M.D.   On: 01/12/2018 06:06   Dg Knee Complete 4 Views Left  Result Date: 01/12/2018 CLINICAL DATA:  Fall walking on the road the twisting injury. EXAM: LEFT KNEE - COMPLETE 4+ VIEW COMPARISON:  Radiograph 10/30/2017 FINDINGS: Prepatellar soft tissue edema. No acute fracture. Tricompartmental osteoarthritis with peripheral spurring and lateral tibiofemoral joint space narrowing. Curvilinear ossification in the region of the MCL. Ossified densities posteriorly likely within a Baker cyst. Similar complex appearing joint effusion to prior exam. IMPRESSION: 1. Prepatellar soft tissue edema.  No acute fracture. 2. Stable chronic findings include tricompartmental osteoarthritis with probable bodies in a Baker's cyst. Complex suprapatellar joint effusion is unchanged. Electronically Signed   By: Rubye Oaks M.D.   On: 01/12/2018 05:57   Dg Hand Complete Left  Result Date: 01/12/2018 CLINICAL DATA:  Fall walking on the road this morning. Grabbed a pole while falling with deformity to fingers. EXAM: LEFT HAND - COMPLETE 3+ VIEW COMPARISON:  None. FINDINGS: Dorsal dislocation of the second digit at the proximal interphalangeal joint. The middle phalanx perched on the proximal phalanx. No visualized fracture. Hand alignment is otherwise maintained. Small cystic density within the third digit middle phalanx may be an epidermal inclusion cyst. Soft tissue edema about the second digit. IMPRESSION: Dorsal dislocation of the index finger at the proximal interphalangeal joint. Electronically Signed   By: Rubye Oaks M.D.   On: 01/12/2018 05:52   Dg Finger Index Left  Result Date: 01/12/2018 CLINICAL DATA:  Postreduction. EXAM: LEFT INDEX FINGER 2+V COMPARISON:  01/12/2018. FINDINGS: Patient is status post reduction of the middle phalanx of the left second digit. Anatomic alignment. No acute bony abnormality. Corticated subchondral cysts noted in the  middle phalanx of the left fourth digit and distal aspect of the left third metacarpal, most likely degenerative. No radiopaque foreign bodies. IMPRESSION: Patient status post reduction of the middle phalanx of the left second digit. Anatomic alignment. No acute bony abnormality. Electronically Signed   By: Maisie Fus  Register   On: 01/12/2018 06:49    Procedures .Marland KitchenLaceration Repair Date/Time: 01/13/2018 4:48 AM Performed by: Rise Mu, PA-C Authorized by: Rise Mu, PA-C   Consent:    Consent obtained:  Verbal   Consent given by:  Patient   Risks discussed:  Need for additional repair, infection, nerve damage, pain, poor cosmetic result, poor wound healing, vascular damage, tendon damage and retained foreign body   Alternatives discussed:  No treatment Anesthesia (see MAR for exact dosages):    Anesthesia  method:  Nerve block   Block needle gauge:  27 G   Block anesthetic:  Bupivacaine 0.5% w/o epi   Block injection procedure:  Anatomic landmarks identified, introduced needle, incremental injection, negative aspiration for blood and anatomic landmarks palpated   Block outcome:  Anesthesia achieved Laceration details:    Location:  Finger   Finger location:  L index finger (Left index and middle finger)   Length (cm):  2.5 Repair type:    Repair type:  Complex Pre-procedure details:    Preparation:  Patient was prepped and draped in usual sterile fashion and imaging obtained to evaluate for foreign bodies Exploration:    Limited defect created (wound extended): no     Hemostasis achieved with:  Direct pressure   Wound exploration: wound explored through full range of motion and entire depth of wound probed and visualized     Wound extent: no foreign bodies/material noted     Wound extent comment:  Underlying dislocation   Contaminated: yes   Treatment:    Area cleansed with:  Betadine and saline   Amount of cleaning:  Extensive   Irrigation solution:  Sterile  saline   Irrigation volume:  200   Irrigation method:  Pressure wash   Visualized foreign bodies/material removed: no     Debridement:  None Skin repair:    Repair method:  Sutures   Suture size:  4-0   Suture material:  Prolene   Suture technique:  Simple interrupted   Number of sutures:  14 Approximation:    Approximation:  Close Post-procedure details:    Dressing:  Antibiotic ointment, bulky dressing, splint for protection and non-adherent dressing   Patient tolerance of procedure:  Tolerated well, no immediate complications  Reduction of dislocation Date/Time: 01/13/2018 5:01 AM Performed by: Rise Mu, PA-C Authorized by: Rise Mu, PA-C  Consent: Verbal consent obtained. Risks and benefits: risks, benefits and alternatives were discussed Consent given by: patient Patient understanding: patient states understanding of the procedure being performed Patient consent: the patient's understanding of the procedure matches consent given Test results: test results available and properly labeled Site marked: the operative site was marked Imaging studies: imaging studies available Patient identity confirmed: verbally with patient Local anesthesia used: yes Anesthesia: digital block  Anesthesia: Local anesthesia used: yes Local Anesthetic: bupivacaine 0.5% without epinephrine Anesthetic total: 8 mL Patient tolerance: Patient tolerated the procedure well with no immediate complications Comments: Postreduction x-ray shows normal alignment.  However patient does seem to be subluxing the joint.  Splint was applied.  May neurovascular intact.    (including critical care time)  Medications Ordered in ED Medications  Tdap (BOOSTRIX) injection 0.5 mL (0.5 mLs Intramuscular Given 01/12/18 0557)  ceFAZolin (ANCEF) IVPB 1 g/50 mL premix (0 g Intravenous Stopped 01/12/18 0657)  bupivacaine (MARCAINE) 0.5 % injection 10 mL (10 mLs Infiltration Given by Other 01/12/18  1610)  bacitracin 500 UNIT/GM ointment (  Given by Other 01/12/18 0757)  acetaminophen (TYLENOL) tablet 650 mg (650 mg Oral Given 01/12/18 0801)     Initial Impression / Assessment and Plan / ED Course  I have reviewed the triage vital signs and the nursing notes.  Pertinent labs & imaging results that were available during my care of the patient were reviewed by me and considered in my medical decision making (see chart for details).     Patient presents with alcohol intoxication and fall with laceration dislocation to the left fingers.  CT imaging of head and  neck were reassuring.  No signs of intra-abdominal intrathoracic trauma.  Patient is neurovascularly intact.  I spoke with Dr. Mina MarbleWeingold concerning the open dislocation.  He has requested that I irrigate the laceration and reduce the dislocation.  He is requested that I suture the laceration and placed in splint and follow-up in outpatient setting.  Patient started on antibiotics.  Given dose of Ancef in the ED will be started on Keflex in outpatient setting.  Dislocation was successfully reduced and postreduction x-ray showed normal alignment.  Patient seems to be subluxing the left index finger PIP.  Splint was placed.  May neurovascularly intact.  Wounds were thoroughly irrigated and sutures were applied.  Patient does appear intoxicated but is alert oriented x3.  Tolerating p.o. fluids and food.  Patient will need to metabolize alcohol prior to discharge.  Sign out given to oncoming provider Dr. Patria Maneampos to reassess patient to make sure that he is clinically sober and safe for discharge.  Patient was also seen by my attending Dr. Read DriversMolpus and he was present for reduction and agrees with the above plan.  Discussed the importance of antibiotics with patient.  I have discussed to return the ED immediately.      Final Clinical Impressions(s) / ED Diagnoses   Final diagnoses:  Laceration of finger of left hand without foreign body without damage  to nail, unspecified finger, initial encounter  Dislocation of finger, initial encounter    ED Discharge Orders         Ordered    cephALEXin (KEFLEX) 500 MG capsule  4 times daily,   Status:  Discontinued     01/12/18 0736    cephALEXin (KEFLEX) 500 MG capsule  4 times daily     01/12/18 0736           Rise MuLeaphart, Shaya Altamura T, PA-C 01/13/18 0513    Molpus, Jonny RuizJohn, MD 01/13/18 [phone]

## 2018-01-29 ENCOUNTER — Encounter (HOSPITAL_BASED_OUTPATIENT_CLINIC_OR_DEPARTMENT_OTHER): Payer: Self-pay | Admitting: Adult Health

## 2018-01-29 ENCOUNTER — Other Ambulatory Visit: Payer: Self-pay

## 2018-01-29 ENCOUNTER — Emergency Department (HOSPITAL_BASED_OUTPATIENT_CLINIC_OR_DEPARTMENT_OTHER)
Admission: EM | Admit: 2018-01-29 | Discharge: 2018-01-29 | Disposition: A | Discharge status: Home / Self Care | Payer: Medicaid Other | Attending: Emergency Medicine | Admitting: Emergency Medicine

## 2018-01-29 DIAGNOSIS — S66101D Unspecified injury of flexor muscle, fascia and tendon of left index finger at wrist and hand level, subsequent encounter: Secondary | ICD-10-CM | POA: Diagnosis not present

## 2018-01-29 DIAGNOSIS — F79 Unspecified intellectual disabilities: Secondary | ICD-10-CM | POA: Insufficient documentation

## 2018-01-29 DIAGNOSIS — I10 Essential (primary) hypertension: Secondary | ICD-10-CM | POA: Insufficient documentation

## 2018-01-29 DIAGNOSIS — Z96651 Presence of right artificial knee joint: Secondary | ICD-10-CM | POA: Diagnosis not present

## 2018-01-29 DIAGNOSIS — S61211D Laceration without foreign body of left index finger without damage to nail, subsequent encounter: Secondary | ICD-10-CM | POA: Diagnosis present

## 2018-01-29 DIAGNOSIS — Z4802 Encounter for removal of sutures: Secondary | ICD-10-CM

## 2018-01-29 DIAGNOSIS — S61213D Laceration without foreign body of left middle finger without damage to nail, subsequent encounter: Secondary | ICD-10-CM | POA: Insufficient documentation

## 2018-01-29 DIAGNOSIS — X58XXXA Exposure to other specified factors, initial encounter: Secondary | ICD-10-CM | POA: Diagnosis not present

## 2018-01-29 DIAGNOSIS — F17200 Nicotine dependence, unspecified, uncomplicated: Secondary | ICD-10-CM | POA: Insufficient documentation

## 2018-01-29 DIAGNOSIS — F1010 Alcohol abuse, uncomplicated: Secondary | ICD-10-CM | POA: Diagnosis not present

## 2018-01-29 DIAGNOSIS — S66802D Unspecified injury of other specified muscles, fascia and tendons at wrist and hand level, left hand, subsequent encounter: Secondary | ICD-10-CM

## 2018-01-29 NOTE — Discharge Instructions (Signed)
Please read and follow all provided instructions.  Your diagnoses today include:  1. Visit for suture removal   2. Injury of flexor tendon of left hand, subsequent encounter    Tests performed today include:  Vital signs. See below for your results today.   Medications prescribed:   None  Take any prescribed medications only as directed.  Home care instructions:  Follow any educational materials contained in this packet.  BE VERY CAREFUL not to take multiple medicines containing Tylenol (also called acetaminophen). Doing so can lead to an overdose which can damage your liver and cause liver failure and possibly death.   Follow-up instructions: Please follow-up with your primary care provider in the next 3 days for further evaluation of your symptoms.   You appear to have an injury to the tendon which allows you to bend your finger on her left hand.  As we discussed, you need to call Dr. Ronie Spies office tomorrow to schedule a follow-up appointment.  If you do not do this, you may no longer be able to bend yout left index finger ever again.  Return instructions:   Please return to the Emergency Department if you experience worsening symptoms.   Please return if you have any other emergent concerns.  Additional Information:  Your vital signs today were: BP 107/78    Pulse 63    Temp 97.9 F (36.6 C)    Resp 18    Ht 6\' 2"  (1.88 m)    Wt 90 kg    SpO2 99%    BMI 25.47 kg/m  If your blood pressure (BP) was elevated above 135/85 this visit, please have this repeated by your doctor within one month. --------------

## 2018-01-29 NOTE — ED Provider Notes (Signed)
[facility] EMERGENCY DEPARTMENT Provider Note   CSN: [id number] Arrival date & time: 01/29/18  1139     History   Chief Complaint Chief Complaint  Patient presents with  . Suture / Staple Removal    HPI Timothy Odonnell is a 62 y.o. male.  Patient presents by EMS with request for suture removal.  Patient sustained lacerations to his left index and long finger on 8/22.  These were repaired.  Patient had a dislocation and visible flexor tendon at that time.  Wounds have reportedly been healing well, however patient is still unable to flex his left index finger.  He states that after his last visit, he filled his medications, but never followed up with any orthopedic specialist.  Per the patient's note, he was supposed to follow-up with Dr. Mina Marble.  Patient is an alcoholic.  No fevers, numbness or tingling.     Past Medical History:  Diagnosis Date  . Arthritis   . Essential hypertension   . GERD (gastroesophageal reflux disease)   . Hyperlipidemia   . Mental retardation     Patient Active Problem List   Diagnosis Date Noted  . Alcohol use   . Dehydration   . Lactic acidosis 05/05/2017  . Knee pain   . Alcohol withdrawal syndrome without complication (HCC)   . Primary localized osteoarthritis of right knee 12/20/2014  . BARRETTS ESOPHAGUS 02/02/2010  . ALCOHOL ABUSE 01/22/2010  . ALCOHOLIC HEPATITIS 01/06/2010  . LOSS OF WEIGHT 11/25/2009  . FATTY LIVER DISEASE 10/27/2009  . VITAMIN D DEFICIENCY 09/29/2009  . ELECTROCARDIOGRAM, ABNORMAL 09/26/2009  . LIVER FUNCTION TESTS, ABNORMAL, HX OF 09/19/2009  . PSYCHIATRIC DISORDER 08/26/2009  . FATIGUE 10/23/2008  . HYPERLIPIDEMIA 11/24/2006  . HYPERTENSION 11/24/2006  . GERD 11/24/2006  . OSTEOARTHRITIS 11/24/2006  . RETARDATION, MENTAL, MILD 11/07/2006    Past Surgical History:  Procedure Laterality Date  . TOTAL KNEE ARTHROPLASTY Right 12/20/2014   Procedure: RIGHT TOTAL KNEE ARTHROPLASTY;  Surgeon:  Frederico Hamman, MD;  Location: [facility] OR;  Service: Orthopedics;  Laterality: Right;        Home Medications    Prior to Admission medications   Medication Sig Start Date End Date Taking? Authorizing Provider  cephALEXin (KEFLEX) 500 MG capsule Take 1 capsule (500 mg total) by mouth 4 (four) times daily. 01/12/18   Rise Mu, PA-C    Family History Family History  Problem Relation Age of Onset  . Diabetes Mother   . Hypertension Mother   . Emphysema Father   . Hypertension Brother   . Stroke Neg Hx     Social History Social History   Tobacco Use  . Smoking status: Current Some Day Smoker  . Smokeless tobacco: Never Used  Substance Use Topics  . Alcohol use: Yes    Comment: Daily.   . Drug use: Yes    Types: Benzodiazepines     Allergies   Sulfamethoxazole-trimethoprim   Review of Systems Review of Systems  Constitutional: Negative for fever.  Musculoskeletal: Negative for arthralgias.  Skin: Positive for wound.  Neurological: Positive for weakness. Negative for numbness.     Physical Exam Updated Vital Signs BP 107/78   Pulse 63   Temp 97.9 F (36.6 C)   Resp 18   Ht 6\' 2"  (1.88 m)   Wt 90 kg   SpO2 99%   BMI 25.47 kg/m   Physical Exam  Constitutional: He appears well-developed and well-nourished.  HENT:  Head: Normocephalic and atraumatic.  Eyes: Conjunctivae are normal.  Neck: Normal range of motion. Neck supple.  Pulmonary/Chest: No respiratory distress.  Musculoskeletal:  2 well-healed wounds with a total of 15 prolene sutures in place, volar aspect of proximal left index and middle fingers. Patient unable to flex index finger at PIP and DIP joints. Other fingers fully mobile.   Neurological: He is alert.  Skin: Skin is warm and dry.  Psychiatric: He has a normal mood and affect.  Nursing note and vitals reviewed.    ED Treatments / Results  Labs (all labs ordered are listed, but only abnormal results are displayed) Labs  Reviewed - No data to display  EKG None  Radiology No results found.  Procedures .Suture Removal Date/Time: 01/29/2018 12:26 PM Performed by: Renne Crigler, PA-C Authorized by: Renne Crigler, PA-C   Consent:    Consent obtained:  Verbal   Consent given by:  Patient   Risks discussed:  Pain Location:    Location:  Upper extremity   Upper extremity location:  Hand   Hand location:  L index finger and L long finger Procedure details:    Wound appearance:  No signs of infection and good wound healing   Number of sutures removed:  15 Post-procedure details:    Patient tolerance of procedure:  Tolerated well, no immediate complications   (including critical care time)  Medications Ordered in ED Medications - No data to display   Initial Impression / Assessment and Plan / ED Course  I have reviewed the triage vital signs and the nursing notes.  Pertinent labs & imaging results that were available during my care of the patient were reviewed by me and considered in my medical decision making (see chart for details).     Patient seen and examined. Sutures removed. Again, stressed need for hand follow-up as patient likely has flexor tendon injury that will not heal. He is once again provided with orthopedic hand follow-up and pt told multiple times by RN and myself that he may not regain ability to flex index finger without follow-up.   Vital signs reviewed and are as follows: BP 107/78   Pulse 63   Temp 97.9 F (36.6 C)   Resp 18   Ht 6\' 2"  (1.88 m)   Wt 90 kg   SpO2 99%   BMI 25.47 kg/m     Final Clinical Impressions(s) / ED Diagnoses   Final diagnoses:  Visit for suture removal  Injury of flexor tendon of left hand, subsequent encounter   Patient here for suture removal.  Wound is healing well.  Finger with continued deficit as described.  Patient strongly encouraged to follow-up with hand specialist.  ED Discharge Orders    None       Renne Crigler,  PA-C 01/29/18 1233    Sabas Sous, MD 01/29/18 1525

## 2018-01-29 NOTE — ED Triage Notes (Signed)
Presents requesting suture removal from left index and middle finger. They were placed on 8/22

## 2018-01-29 NOTE — ED Notes (Signed)
Pt requested ice pack.

## 2018-02-17 ENCOUNTER — Encounter (HOSPITAL_COMMUNITY): Payer: Self-pay | Admitting: *Deleted

## 2018-02-17 ENCOUNTER — Emergency Department (HOSPITAL_COMMUNITY)
Admission: EM | Admit: 2018-02-17 | Discharge: 2018-02-17 | Disposition: A | Discharge status: Home / Self Care | Payer: Medicaid Other | Attending: Emergency Medicine | Admitting: Emergency Medicine

## 2018-02-17 ENCOUNTER — Other Ambulatory Visit: Payer: Self-pay

## 2018-02-17 ENCOUNTER — Emergency Department (HOSPITAL_COMMUNITY): Payer: Medicaid Other

## 2018-02-17 DIAGNOSIS — Z79899 Other long term (current) drug therapy: Secondary | ICD-10-CM | POA: Diagnosis not present

## 2018-02-17 DIAGNOSIS — Y92007 Garden or yard of unspecified non-institutional (private) residence as the place of occurrence of the external cause: Secondary | ICD-10-CM | POA: Insufficient documentation

## 2018-02-17 DIAGNOSIS — F17200 Nicotine dependence, unspecified, uncomplicated: Secondary | ICD-10-CM | POA: Insufficient documentation

## 2018-02-17 DIAGNOSIS — I10 Essential (primary) hypertension: Secondary | ICD-10-CM | POA: Insufficient documentation

## 2018-02-17 DIAGNOSIS — Z96651 Presence of right artificial knee joint: Secondary | ICD-10-CM | POA: Diagnosis not present

## 2018-02-17 DIAGNOSIS — Y93H9 Activity, other involving exterior property and land maintenance, building and construction: Secondary | ICD-10-CM | POA: Insufficient documentation

## 2018-02-17 DIAGNOSIS — F70 Mild intellectual disabilities: Secondary | ICD-10-CM | POA: Diagnosis not present

## 2018-02-17 DIAGNOSIS — Y999 Unspecified external cause status: Secondary | ICD-10-CM | POA: Diagnosis not present

## 2018-02-17 DIAGNOSIS — W010XXA Fall on same level from slipping, tripping and stumbling without subsequent striking against object, initial encounter: Secondary | ICD-10-CM | POA: Insufficient documentation

## 2018-02-17 DIAGNOSIS — M25562 Pain in left knee: Secondary | ICD-10-CM

## 2018-02-17 MED ORDER — NAPROXEN 500 MG PO TABS: 500.0000 mg | ORAL_TABLET | Freq: Two times a day (BID) | ORAL | 0 refills | Status: DC | PRN

## 2018-02-17 MED ORDER — MELOXICAM 15 MG PO TABS
15.0000 mg | ORAL_TABLET | Freq: Every day | ORAL | Status: DC
  Administered 2018-02-17: 15 mg via ORAL
  Filled 2018-02-17: qty 1

## 2018-02-17 NOTE — ED Provider Notes (Signed)
[facility]-EMERGENCY DEPT Provider Note   CSN: [id number] Arrival date & time: 02/17/18  0303     History   Chief Complaint Chief Complaint  Patient presents with  . Knee Pain    HPI Timothy Odonnell is a 62 y.o. male.   62 year old male presents to the emergency department for evaluation of left knee pain.  He reports falling around 1 AM, but states that he fell after he was using his riding lawnmower and tripped.  States that he was mowing his lawn during daylight hours.  Denies any head trauma or loss of consciousness.  Has been ambulatory since the incident, experiencing pain and swelling to the left knee.  Denies taking any medications prior to arrival for pain.  This pain has remained constant.  No extremity numbness, paresthesias, weakness.  Denies decreased range of motion.     Past Medical History:  Diagnosis Date  . Arthritis   . Essential hypertension   . GERD (gastroesophageal reflux disease)   . Hyperlipidemia   . Mental retardation     Patient Active Problem List   Diagnosis Date Noted  . Alcohol use   . Dehydration   . Lactic acidosis 05/05/2017  . Knee pain   . Alcohol withdrawal syndrome without complication (HCC)   . Primary localized osteoarthritis of right knee 12/20/2014  . BARRETTS ESOPHAGUS 02/02/2010  . ALCOHOL ABUSE 01/22/2010  . ALCOHOLIC HEPATITIS 01/06/2010  . LOSS OF WEIGHT 11/25/2009  . FATTY LIVER DISEASE 10/27/2009  . VITAMIN D DEFICIENCY 09/29/2009  . ELECTROCARDIOGRAM, ABNORMAL 09/26/2009  . LIVER FUNCTION TESTS, ABNORMAL, HX OF 09/19/2009  . PSYCHIATRIC DISORDER 08/26/2009  . FATIGUE 10/23/2008  . HYPERLIPIDEMIA 11/24/2006  . HYPERTENSION 11/24/2006  . GERD 11/24/2006  . OSTEOARTHRITIS 11/24/2006  . RETARDATION, MENTAL, MILD 11/07/2006    Past Surgical History:  Procedure Laterality Date  . TOTAL KNEE ARTHROPLASTY Right 12/20/2014   Procedure: RIGHT TOTAL KNEE ARTHROPLASTY;  Surgeon: Frederico Hamman,  MD;  Location: [facility] OR;  Service: Orthopedics;  Laterality: Right;        Home Medications    Prior to Admission medications   Medication Sig Start Date End Date Taking? Authorizing Provider  cephALEXin (KEFLEX) 500 MG capsule Take 1 capsule (500 mg total) by mouth 4 (four) times daily. 01/12/18   Rise Mu, PA-C  naproxen (NAPROSYN) 500 MG tablet Take 1 tablet (500 mg total) by mouth every 12 (twelve) hours as needed for mild pain or moderate pain. 02/17/18   Antony Madura, PA-C    Family History Family History  Problem Relation Age of Onset  . Diabetes Mother   . Hypertension Mother   . Emphysema Father   . Hypertension Brother   . Stroke Neg Hx     Social History Social History   Tobacco Use  . Smoking status: Current Some Day Smoker  . Smokeless tobacco: Never Used  Substance Use Topics  . Alcohol use: Yes    Comment: Daily.   . Drug use: Yes    Types: Benzodiazepines     Allergies   Sulfamethoxazole-trimethoprim   Review of Systems Review of Systems Ten systems reviewed and are negative for acute change, except as noted in the HPI.    Physical Exam Updated Vital Signs BP (!) 144/98   Pulse 72   Temp 98 F (36.7 C)   Resp 14   SpO2 99%   Physical Exam  Constitutional: He is oriented to person, place, and time. He appears  well-developed and well-nourished. No distress.  Nontoxic appearing and in NAD  HENT:  Head: Normocephalic and atraumatic.  Eyes: Conjunctivae and EOM are normal. No scleral icterus.  Neck: Normal range of motion.  Cardiovascular: Normal rate, regular rhythm and intact distal pulses.  DP pulse 2+ in the LLE  Pulmonary/Chest: Effort normal. No stridor. No respiratory distress.  Respirations even and unlabored  Musculoskeletal: Normal range of motion.       Left knee: He exhibits effusion. He exhibits normal range of motion, no erythema, no LCL laxity and no MCL laxity.  Full AROM and PROM of the L knee. Palpable  suprapatellar effusion. No reproducible pain on palpation. No bony deformity or crepitus.  Neurological: He is alert and oriented to person, place, and time. He exhibits normal muscle tone. Coordination normal.  Sensation to light touch intact. Normal plantar and dorsiflexion against resistance.  Skin: Skin is warm and dry. No rash noted. He is not diaphoretic. No erythema. No pallor.  Psychiatric: He has a normal mood and affect. His behavior is normal.  Nursing note and vitals reviewed.    ED Treatments / Results  Labs (all labs ordered are listed, but only abnormal results are displayed) Labs Reviewed - No data to display  EKG None  Radiology Dg Knee Complete 4 Views Left  Result Date: 02/17/2018 CLINICAL DATA:  62 year old male with fall and left knee pain. EXAM: LEFT KNEE - COMPLETE 4+ VIEW COMPARISON:  Left knee radiograph dated 01/12/2018 FINDINGS: There is no acute fracture or dislocation. There is severe arthritic changes with tri compartmental narrowing and spurring most prominent involving the lateral compartment. Stable amorphous calcification of the posterior knee. Small suprapatellar effusion. Diffuse subcutaneous edema and soft tissue swelling over the knee similar to prior radiograph. IMPRESSION: 1. No acute fracture or dislocation. 2. Soft tissue swelling over the knee. 3. Severe osteoarthritic changes and small suprapatellar effusion. Electronically Signed   By: Elgie Collard M.D.   On: 02/17/2018 04:35    Procedures Procedures (including critical care time)  Medications Ordered in ED Medications  meloxicam (MOBIC) tablet 15 mg (has no administration in time range)     Initial Impression / Assessment and Plan / ED Course  I have reviewed the triage vital signs and the nursing notes.  Pertinent labs & imaging results that were available during my care of the patient were reviewed by me and considered in my medical decision making (see chart for details).      Patient presents to the emergency department for evaluation of L knee pain. Patient neurovascularly intact on exam. Imaging negative for fracture, dislocation, bony deformity. No erythema, heat to touch to the affected area; no concern for septic joint. Compartments in the affected extremity are soft.   Plan for supportive management including RICE and NSAIDs.  Will refer to Orthopedics as patient has been followed by Dr. Madelon Lips in the past; primary care follow up as needed. Return precautions discussed and provided. Patient discharged in stable condition with no unaddressed concerns.   Final Clinical Impressions(s) / ED Diagnoses   Final diagnoses:  Acute pain of left knee    ED Discharge Orders         Ordered    naproxen (NAPROSYN) 500 MG tablet  Every 12 hours PRN     02/17/18 0525           Antony Madura, PA-C 02/17/18 0534    Palumbo, April, MD 02/17/18 6045

## 2018-02-17 NOTE — Discharge Instructions (Signed)
Your x-ray in the emergency department did not show any fracture or broken bones.  We recommend that you use a knee sleeve for stability.  Take naproxen as prescribed for pain.  Follow-up with Dr. [facility] for further evaluation of your symptoms.

## 2018-02-17 NOTE — ED Notes (Signed)
Bus pass given 

## 2018-02-17 NOTE — ED Triage Notes (Signed)
Pt arrives via ems with c/o left knee pain after falling about 2 hours ago. No LOC. Swelling to the left knee. 142/90, hr 90, cbg 73

## 2018-03-19 ENCOUNTER — Emergency Department (HOSPITAL_COMMUNITY): Payer: Medicaid Other

## 2018-03-19 ENCOUNTER — Emergency Department (HOSPITAL_COMMUNITY)
Admission: EM | Admit: 2018-03-19 | Discharge: 2018-03-19 | Disposition: A | Discharge status: Home / Self Care | Payer: Medicaid Other | Attending: Emergency Medicine | Admitting: Emergency Medicine

## 2018-03-19 DIAGNOSIS — T68XXXA Hypothermia, initial encounter: Secondary | ICD-10-CM

## 2018-03-19 DIAGNOSIS — F17210 Nicotine dependence, cigarettes, uncomplicated: Secondary | ICD-10-CM | POA: Diagnosis not present

## 2018-03-19 DIAGNOSIS — Z79899 Other long term (current) drug therapy: Secondary | ICD-10-CM | POA: Insufficient documentation

## 2018-03-19 DIAGNOSIS — R4020 Unspecified coma: Secondary | ICD-10-CM | POA: Insufficient documentation

## 2018-03-19 DIAGNOSIS — F1010 Alcohol abuse, uncomplicated: Secondary | ICD-10-CM | POA: Diagnosis present

## 2018-03-19 DIAGNOSIS — I10 Essential (primary) hypertension: Secondary | ICD-10-CM | POA: Diagnosis not present

## 2018-03-19 DIAGNOSIS — E785 Hyperlipidemia, unspecified: Secondary | ICD-10-CM | POA: Diagnosis not present

## 2018-03-19 DIAGNOSIS — X31XXXA Exposure to excessive natural cold, initial encounter: Secondary | ICD-10-CM | POA: Diagnosis not present

## 2018-03-19 LAB — COMPREHENSIVE METABOLIC PANEL
ALK PHOS: 100 U/L (ref 38–126)
ALT: 38 U/L (ref 0–44)
AST: 90 U/L — ABNORMAL HIGH (ref 15–41)
Albumin: 4.1 g/dL (ref 3.5–5.0)
Anion gap: 18 — ABNORMAL HIGH (ref 5–15)
BUN: 17 mg/dL (ref 8–23)
CALCIUM: 9 mg/dL (ref 8.9–10.3)
CO2: 21 mmol/L — AB (ref 22–32)
CREATININE: 0.82 mg/dL (ref 0.61–1.24)
Chloride: 102 mmol/L (ref 98–111)
GFR calc non Af Amer: >60 mL/min (ref >60)
Glucose, Bld: 135 mg/dL — ABNORMAL HIGH (ref 70–99)
Potassium: 3.7 mmol/L (ref 3.5–5.1)
Sodium: 141 mmol/L (ref 135–145)
Total Bilirubin: 0.4 mg/dL (ref 0.3–1.2)
Total Protein: 8.5 g/dL — ABNORMAL HIGH (ref 6.5–8.1)

## 2018-03-19 LAB — URINALYSIS, ROUTINE W REFLEX MICROSCOPIC
Bilirubin Urine: NEGATIVE
GLUCOSE, UA: NEGATIVE mg/dL
KETONES UR: 20 mg/dL — AB
Leukocytes, UA: NEGATIVE
Nitrite: NEGATIVE
PROTEIN: NEGATIVE mg/dL
Specific Gravity, Urine: 1.011 (ref 1.005–1.030)
pH: 5 (ref 5.0–8.0)

## 2018-03-19 LAB — CBC WITH DIFFERENTIAL/PLATELET
Abs Immature Granulocytes: 0.03 10*3/uL (ref 0.00–0.07)
Basophils Absolute: 0 10*3/uL (ref 0.0–0.1)
Basophils Relative: 0 %
EOS ABS: 0 10*3/uL (ref 0.0–0.5)
EOS PCT: 0 %
HEMATOCRIT: 42 % (ref 39.0–52.0)
Hemoglobin: 13.7 g/dL (ref 13.0–17.0)
Immature Granulocytes: 0 %
LYMPHS ABS: 0.8 10*3/uL (ref 0.7–4.0)
Lymphocytes Relative: 12 %
MCH: 31.5 pg (ref 26.0–34.0)
MCHC: 32.6 g/dL (ref 30.0–36.0)
MCV: 96.6 fL (ref 80.0–100.0)
MONOS PCT: 2 %
Monocytes Absolute: 0.1 10*3/uL (ref 0.1–1.0)
NRBC: 0 % (ref 0.0–0.2)
Neutro Abs: 5.8 10*3/uL (ref 1.7–7.7)
Neutrophils Relative %: 86 %
Platelets: 173 10*3/uL (ref 150–400)
RBC: 4.35 MIL/uL (ref 4.22–5.81)
RDW: 14.6 % (ref 11.5–15.5)
WBC: 6.8 10*3/uL (ref 4.0–10.5)

## 2018-03-19 LAB — RAPID URINE DRUG SCREEN, HOSP PERFORMED
AMPHETAMINES: NOT DETECTED
BENZODIAZEPINES: NOT DETECTED
Barbiturates: NOT DETECTED
Cocaine: NOT DETECTED
Opiates: NOT DETECTED
Tetrahydrocannabinol: NOT DETECTED

## 2018-03-19 LAB — CBG MONITORING, ED
GLUCOSE-CAPILLARY: 88 mg/dL (ref 70–99)
Glucose-Capillary: 68 mg/dL — ABNORMAL LOW (ref 70–99)
Glucose-Capillary: 125 mg/dL — ABNORMAL HIGH (ref 70–99)
Glucose-Capillary: 112 mg/dL — ABNORMAL HIGH (ref 70–99)

## 2018-03-19 LAB — ETHANOL: ALCOHOL ETHYL (B): 379 mg/dL — AB (ref <10)

## 2018-03-19 MED ORDER — SODIUM CHLORIDE 0.9 % IV BOLUS
1000.0000 mL | Freq: Once | INTRAVENOUS | Status: AC
  Administered 2018-03-19: 1000 mL via INTRAVENOUS

## 2018-03-19 NOTE — ED Notes (Signed)
Matt Holmes hugger applied. Max heat.

## 2018-03-19 NOTE — ED Triage Notes (Signed)
Pt BIB EMS.  Found on side of road, unconscious.  Initial CBG was 59, pt given tube oral glucose, went to 63, + banana.  Rechecked and was 58.  Pt known hx of ETOH.

## 2018-03-19 NOTE — ED Notes (Signed)
Bed: WA09 Expected date:  Expected time:  Means of arrival:  Comments: 62 yo ETOH; hypoglycemia, hypothermic

## 2018-03-19 NOTE — ED Provider Notes (Signed)
[facility]-EMERGENCY DEPT Provider Note   CSN: [id number] Arrival date & time: 03/19/18  1032     History   Chief Complaint Chief Complaint  Patient presents with  . Cold Exposure  . Hypoglycemia    HPI JERELLE VIRDEN is a 62 y.o. male.  Pt presents to the ED with alcohol intox.  The pt was found on the side of the road unconscious.  His initial CBG was 59.  He was given oral glucose + a banana.  The pt's clothes were wet (it's been raining) and he admits to drinking alcohol.  He is known to [place] in the ED for alcohol abuse.  Pt is now awake and alert.     Past Medical History:  Diagnosis Date  . Arthritis   . Essential hypertension   . GERD (gastroesophageal reflux disease)   . Hyperlipidemia   . Mental retardation     Patient Active Problem List   Diagnosis Date Noted  . Alcohol use   . Dehydration   . Lactic acidosis 05/05/2017  . Knee pain   . Alcohol withdrawal syndrome without complication (HCC)   . Primary localized osteoarthritis of right knee 12/20/2014  . BARRETTS ESOPHAGUS 02/02/2010  . ALCOHOL ABUSE 01/22/2010  . ALCOHOLIC HEPATITIS 01/06/2010  . LOSS OF WEIGHT 11/25/2009  . FATTY LIVER DISEASE 10/27/2009  . VITAMIN D DEFICIENCY 09/29/2009  . ELECTROCARDIOGRAM, ABNORMAL 09/26/2009  . LIVER FUNCTION TESTS, ABNORMAL, HX OF 09/19/2009  . PSYCHIATRIC DISORDER 08/26/2009  . FATIGUE 10/23/2008  . HYPERLIPIDEMIA 11/24/2006  . HYPERTENSION 11/24/2006  . GERD 11/24/2006  . OSTEOARTHRITIS 11/24/2006  . RETARDATION, MENTAL, MILD 11/07/2006    Past Surgical History:  Procedure Laterality Date  . TOTAL KNEE ARTHROPLASTY Right 12/20/2014   Procedure: RIGHT TOTAL KNEE ARTHROPLASTY;  Surgeon: Frederico Hamman, MD;  Location: [facility] OR;  Service: Orthopedics;  Laterality: Right;        Home Medications    Prior to Admission medications   Medication Sig Start Date End Date Taking? Authorizing Provider  cephALEXin (KEFLEX) 500 MG capsule  Take 1 capsule (500 mg total) by mouth 4 (four) times daily. Patient not taking: Reported on 03/19/2018 01/12/18   Demetrios Loll T, PA-C  naproxen (NAPROSYN) 500 MG tablet Take 1 tablet (500 mg total) by mouth every 12 (twelve) hours as needed for mild pain or moderate pain. 02/17/18   Antony Madura, PA-C    Family History Family History  Problem Relation Age of Onset  . Diabetes Mother   . Hypertension Mother   . Emphysema Father   . Hypertension Brother   . Stroke Neg Hx     Social History Social History   Tobacco Use  . Smoking status: Current Some Day Smoker  . Smokeless tobacco: Never Used  Substance Use Topics  . Alcohol use: Yes    Comment: Daily.   . Drug use: Yes    Types: Benzodiazepines     Allergies   Sulfamethoxazole-trimethoprim   Review of Systems Review of Systems  All other systems reviewed and are negative.    Physical Exam Updated Vital Signs BP (!) 97/59   Pulse 87   Temp (!) 96.1 F (35.6 C) (Rectal)   Resp 14   SpO2 94%   Physical Exam  Constitutional: He appears well-developed and well-nourished.  Clothes are wet. He smells of urine and alcohol.  HENT:  Head: Normocephalic and atraumatic.  Right Ear: External ear normal.  Left Ear: External ear normal.  Nose: Nose normal.  Mouth/Throat: Oropharynx is clear and moist.  Eyes: Pupils are equal, round, and reactive to light. Conjunctivae and EOM are normal.  Neck: Normal range of motion. Neck supple.  Cardiovascular: Normal rate, regular rhythm, normal heart sounds and intact distal pulses.  Pulmonary/Chest: Effort normal and breath sounds normal.  Abdominal: Soft. Bowel sounds are normal.  Musculoskeletal: Normal range of motion.  Neurological: He is alert.  Skin: Capillary refill takes less than 2 seconds.  Skin feels cool  Psychiatric: He is slowed.  Nursing note and vitals reviewed.    ED Treatments / Results  Labs (all labs ordered are listed, but only abnormal results  are displayed) Labs Reviewed  COMPREHENSIVE METABOLIC PANEL - Abnormal; Notable for the following components:      Result Value   CO2 21 (*)    Glucose, Bld 135 (*)    Total Protein 8.5 (*)    AST 90 (*)    Anion gap 18 (*)    All other components within normal limits  URINALYSIS, ROUTINE W REFLEX MICROSCOPIC - Abnormal; Notable for the following components:   Hgb urine dipstick LARGE (*)    Ketones, ur 20 (*)    Bacteria, UA RARE (*)    All other components within normal limits  ETHANOL - Abnormal; Notable for the following components:   Alcohol, Ethyl (B) 379 (*)    All other components within normal limits  CBG MONITORING, ED - Abnormal; Notable for the following components:   Glucose-Capillary 68 (*)    All other components within normal limits  CBG MONITORING, ED - Abnormal; Notable for the following components:   Glucose-Capillary 125 (*)    All other components within normal limits  CBG MONITORING, ED - Abnormal; Notable for the following components:   Glucose-Capillary 112 (*)    All other components within normal limits  CBC WITH DIFFERENTIAL/PLATELET  RAPID URINE DRUG SCREEN, HOSP PERFORMED    EKG EKG Interpretation  Date/Time:  Sunday March 19 2018 11:25:57 EDT Ventricular Rate:  67 PR Interval:    QRS Duration: 116 QT Interval:  450 QTC Calculation: 476 R Axis:   -17 Text Interpretation:  nsr Nonspecific intraventricular conduction delay poor baseline Confirmed by Jacalyn Lefevre [phone]) on 03/19/2018 11:40:14 AM   Radiology No results found.  Procedures Procedures (including critical care time)  Medications Ordered in ED Medications  sodium chloride 0.9 % bolus 1,000 mL (0 mLs Intravenous Stopped 03/19/18 1517)     Initial Impression / Assessment and Plan / ED Course  I have reviewed the triage vital signs and the nursing notes.  Pertinent labs & imaging results that were available during my care of the patient were reviewed by me and  considered in my medical decision making (see chart for details).    Pt's body temp has improved, but it is still not normal.  He is also c/o left hand pain.  So, a CT head/neck was ordered.  Pt also had a hand xray that was ordered.    Results pending at shift change.  Pt signed out to Dr. Juleen China.  Final Clinical Impressions(s) / ED Diagnoses   Final diagnoses:  Hypothermia, initial encounter  Alcohol abuse    ED Discharge Orders    None       Jacalyn Lefevre, MD 03/19/18 1621

## 2018-03-19 NOTE — ED Notes (Signed)
Matt Holmes hugger removed, pt rectal temp 97.7.

## 2018-05-13 ENCOUNTER — Emergency Department (HOSPITAL_COMMUNITY): Payer: Medicaid Other

## 2018-05-13 ENCOUNTER — Encounter (HOSPITAL_COMMUNITY): Payer: Self-pay | Admitting: *Deleted

## 2018-05-13 ENCOUNTER — Emergency Department (HOSPITAL_COMMUNITY)
Admission: EM | Admit: 2018-05-13 | Discharge: 2018-05-13 | Disposition: A | Discharge status: Home / Self Care | Payer: Medicaid Other | Attending: Emergency Medicine | Admitting: Emergency Medicine

## 2018-05-13 DIAGNOSIS — I10 Essential (primary) hypertension: Secondary | ICD-10-CM | POA: Diagnosis not present

## 2018-05-13 DIAGNOSIS — F17200 Nicotine dependence, unspecified, uncomplicated: Secondary | ICD-10-CM | POA: Diagnosis not present

## 2018-05-13 DIAGNOSIS — Z96651 Presence of right artificial knee joint: Secondary | ICD-10-CM | POA: Insufficient documentation

## 2018-05-13 DIAGNOSIS — M25562 Pain in left knee: Secondary | ICD-10-CM | POA: Insufficient documentation

## 2018-05-13 MED ORDER — IBUPROFEN 200 MG PO TABS
600.0000 mg | ORAL_TABLET | Freq: Once | ORAL | Status: AC
  Administered 2018-05-13: 600 mg via ORAL
  Filled 2018-05-13: qty 3

## 2018-05-13 MED ORDER — NAPROXEN 500 MG PO TABS: 500.0000 mg | ORAL_TABLET | Freq: Two times a day (BID) | ORAL | 0 refills | Status: AC

## 2018-05-13 NOTE — Discharge Instructions (Signed)
Dr. Madelon Lipsaffrey is the doctor that did the surgery on the right knee. Take the medication for pain and follow up  if symptoms persist.

## 2018-05-13 NOTE — ED Provider Notes (Signed)
[facility]-EMERGENCY DEPT Provider Note   CSN: [id number] Arrival date & time: 05/13/18  1540     History   Chief Complaint Chief Complaint  Patient presents with  . Knee Pain    HPI Timothy Odonnell is a 62 y.o. male who presents to the ED for knee pain. The pain is located in the right knee. The pain has been chronic. Patient reports he has arthritis and has had a knee replacement on the right and told he will need one on the left knee.  Patient admits to drinking alcohol today.   HPI  Past Medical History:  Diagnosis Date  . Arthritis   . Essential hypertension   . GERD (gastroesophageal reflux disease)   . Hyperlipidemia   . Mental retardation     Patient Active Problem List   Diagnosis Date Noted  . Alcohol use   . Dehydration   . Lactic acidosis 05/05/2017  . Knee pain   . Alcohol withdrawal syndrome without complication (HCC)   . Primary localized osteoarthritis of right knee 12/20/2014  . BARRETTS ESOPHAGUS 02/02/2010  . ALCOHOL ABUSE 01/22/2010  . ALCOHOLIC HEPATITIS 01/06/2010  . LOSS OF WEIGHT 11/25/2009  . FATTY LIVER DISEASE 10/27/2009  . VITAMIN D DEFICIENCY 09/29/2009  . ELECTROCARDIOGRAM, ABNORMAL 09/26/2009  . LIVER FUNCTION TESTS, ABNORMAL, HX OF 09/19/2009  . PSYCHIATRIC DISORDER 08/26/2009  . FATIGUE 10/23/2008  . HYPERLIPIDEMIA 11/24/2006  . HYPERTENSION 11/24/2006  . GERD 11/24/2006  . OSTEOARTHRITIS 11/24/2006  . RETARDATION, MENTAL, MILD 11/07/2006    Past Surgical History:  Procedure Laterality Date  . TOTAL KNEE ARTHROPLASTY Right 12/20/2014   Procedure: RIGHT TOTAL KNEE ARTHROPLASTY;  Surgeon: Frederico Hammananiel Caffrey, MD;  [facility];  Service: Orthopedics;  Laterality: Right;        Home Medications    Prior to Admission medications   Medication Sig Start Date End Date Taking? Authorizing Provider  cephALEXin (KEFLEX) 500 MG capsule Take 1 capsule (500 mg total) by mouth 4 (four) times daily. Patient not  taking: Reported on 03/19/2018 01/12/18   Demetrios LollLeaphart, Kenneth T, PA-C  naproxen (NAPROSYN) 500 MG tablet Take 1 tablet (500 mg total) by mouth every 12 (twelve) hours as needed for mild pain or moderate pain. 02/17/18   Antony MaduraHumes, Kelly, PA-C    Family History Family History  Problem Relation Age of Onset  . Diabetes Mother   . Hypertension Mother   . Emphysema Father   . Hypertension Brother   . Stroke Neg Hx     Social History Social History   Tobacco Use  . Smoking status: Current Some Day Smoker  . Smokeless tobacco: Never Used  Substance Use Topics  . Alcohol use: Yes    Comment: Daily.   . Drug use: Yes    Types: Benzodiazepines     Allergies   Sulfamethoxazole-trimethoprim   Review of Systems Review of Systems  Musculoskeletal: Positive for arthralgias.       Left knee pain  All other systems reviewed and are negative.    Physical Exam Updated Vital Signs BP (!) 133/95 (BP Location: Right Arm)   Pulse 83   Temp 97.8 F (36.6 C) (Oral)   Resp 18   Ht 6\' 2"  (1.88 m)   Wt 102.1 kg   SpO2 91%   BMI 28.89 kg/m   Physical Exam Vitals signs and nursing note reviewed.  Constitutional:      General: He is not in acute distress.    Appearance:  He is well-developed.  HENT:     Head: Normocephalic.  Neck:     Musculoskeletal: Neck supple.  Cardiovascular:     Rate and Rhythm: Normal rate.  Pulmonary:     Effort: Pulmonary effort is normal.  Musculoskeletal:     Left knee: He exhibits swelling. He exhibits no deformity, no laceration, no erythema and normal alignment. Tenderness found.       Legs:     Comments: Pedal pulse 2+. Left knee with slightly increased warmth compared to right.   Skin:    General: Skin is warm and dry.  Neurological:     Mental Status: He is alert and oriented to person, place, and time.      ED Treatments / Results  Labs (all labs ordered are listed, but only abnormal results are displayed) Labs Reviewed - No data to  display  Radiology Dg Knee Complete 4 Views Left  Result Date: 05/13/2018 CLINICAL DATA:  Medial LEFT knee pain after slipping and falling today EXAM: LEFT KNEE - COMPLETE 4+ VIEW COMPARISON:  None FINDINGS: Osseous demineralization. Tricompartmental degenerative changes with joint space narrowing and spur formation. Multiple calcified loose bodies posteromedially. No acute fracture, dislocation, or bone destruction. Minimal joint effusion. IMPRESSION: Osteoarthritic changes LEFT knee with calcified bodies posteromedial to knee question within a Baker cyst. No acute fracture or dislocation identified. Electronically Signed   By: Ulyses SouthwardMark  Boles M.D.   On: 05/13/2018 16:26    Procedures Procedures (including critical care time)  Medications Ordered in ED Medications - No data to display   Initial Impression / Assessment and Plan / ED Course  I have reviewed the triage vital signs and the nursing notes.  62 y.o. male here with left knee pain and swelling stable for d/c without fracture or dislocation noted on x-ray. Patient has a knee sleeve and will give pain medication. Patient to f/u with ortho. Return precautions discussed.  Final Clinical Impressions(s) / ED Diagnoses   Final diagnoses:  None  left knee pain with small effusion.   ED Discharge Orders    None       Kerrie Buffaloeese, Hermena Swint Rush[place], [place]NP 05/13/18 1825    Rolan BuccoBelfi, Melanie, MD 05/13/18 2250

## 2018-05-13 NOTE — ED Triage Notes (Signed)
Pt complains of right knee pain since slipping a falling. Pt has been drinking alcohol  CBG 89 BP 141/91 99% on RA HR 79

## 2018-05-16 ENCOUNTER — Other Ambulatory Visit: Payer: Self-pay

## 2018-05-16 ENCOUNTER — Emergency Department (HOSPITAL_COMMUNITY)
Admission: EM | Admit: 2018-05-16 | Discharge: 2018-05-16 | Disposition: A | Discharge status: Home / Self Care | Payer: Medicaid Other | Attending: Emergency Medicine | Admitting: Emergency Medicine

## 2018-05-16 ENCOUNTER — Encounter (HOSPITAL_COMMUNITY): Payer: Self-pay | Admitting: Emergency Medicine

## 2018-05-16 DIAGNOSIS — R6883 Chills (without fever): Secondary | ICD-10-CM | POA: Diagnosis present

## 2018-05-16 DIAGNOSIS — F17200 Nicotine dependence, unspecified, uncomplicated: Secondary | ICD-10-CM | POA: Insufficient documentation

## 2018-05-16 DIAGNOSIS — I10 Essential (primary) hypertension: Secondary | ICD-10-CM | POA: Insufficient documentation

## 2018-05-16 NOTE — ED Notes (Signed)
Patient states he has not followed up with his dr about his knee but he came here because he couldn't take the cold any longer. Patient states he needs his heat fixed.

## 2018-05-16 NOTE — ED Triage Notes (Signed)
Patient came in by [facility]. Patient states his heat went out in his out. Patient states he is cold. Patient was here a week ago for left knee pain. Patient states the pain is still in his knee.

## 2018-05-16 NOTE — ED Provider Notes (Signed)
[facility]-EMERGENCY DEPT Provider Note   CSN: [id number] Arrival date & time: 05/16/18  0439     History   Chief Complaint Chief Complaint  Patient presents with  . Chills    from having no heat    HPI Timothy Odonnell is a 62 y.o. male.  The history is provided by the patient.  Illness  This is a new problem. The current episode started 6 to 12 hours ago. The problem occurs rarely. The problem has been resolved. Pertinent negatives include no chest pain, no abdominal pain, no headaches and no shortness of breath. Nothing aggravates the symptoms. Nothing relieves the symptoms. He has tried nothing for the symptoms. The treatment provided no relief.    Past Medical History:  Diagnosis Date  . Arthritis   . Essential hypertension   . GERD (gastroesophageal reflux disease)   . Hyperlipidemia   . Mental retardation     Patient Active Problem List   Diagnosis Date Noted  . Alcohol use   . Dehydration   . Lactic acidosis 05/05/2017  . Knee pain   . Alcohol withdrawal syndrome without complication (HCC)   . Primary localized osteoarthritis of right knee 12/20/2014  . BARRETTS ESOPHAGUS 02/02/2010  . ALCOHOL ABUSE 01/22/2010  . ALCOHOLIC HEPATITIS 01/06/2010  . LOSS OF WEIGHT 11/25/2009  . FATTY LIVER DISEASE 10/27/2009  . VITAMIN D DEFICIENCY 09/29/2009  . ELECTROCARDIOGRAM, ABNORMAL 09/26/2009  . LIVER FUNCTION TESTS, ABNORMAL, HX OF 09/19/2009  . PSYCHIATRIC DISORDER 08/26/2009  . FATIGUE 10/23/2008  . HYPERLIPIDEMIA 11/24/2006  . HYPERTENSION 11/24/2006  . GERD 11/24/2006  . OSTEOARTHRITIS 11/24/2006  . RETARDATION, MENTAL, MILD 11/07/2006    Past Surgical History:  Procedure Laterality Date  . TOTAL KNEE ARTHROPLASTY Right 12/20/2014   Procedure: RIGHT TOTAL KNEE ARTHROPLASTY;  Surgeon: Frederico Hammananiel Caffrey, MD;  [facility];  Service: Orthopedics;  Laterality: Right;        Home Medications    Prior to Admission medications     Medication Sig Start Date End Date Taking? Authorizing Provider  naproxen (NAPROSYN) 500 MG tablet Take 1 tablet (500 mg total) by mouth 2 (two) times daily. 05/13/18   Janne NapoleonNeese, Hope M, NP    Family History Family History  Problem Relation Age of Onset  . Diabetes Mother   . Hypertension Mother   . Emphysema Father   . Hypertension Brother   . Stroke Neg Hx     Social History Social History   Tobacco Use  . Smoking status: Current Some Day Smoker  . Smokeless tobacco: Never Used  Substance Use Topics  . Alcohol use: Yes    Comment: Daily.   . Drug use: Yes    Types: Benzodiazepines     Allergies   Sulfamethoxazole-trimethoprim   Review of Systems Review of Systems  Constitutional: Positive for chills. Negative for fever.  HENT: Negative for ear pain and sore throat.   Eyes: Negative for pain and visual disturbance.  Respiratory: Negative for cough and shortness of breath.   Cardiovascular: Negative for chest pain and palpitations.  Gastrointestinal: Negative for abdominal pain and vomiting.  Genitourinary: Negative for dysuria and hematuria.  Musculoskeletal: Negative for arthralgias and back pain.  Skin: Negative for color change and rash.  Neurological: Negative for seizures, syncope and headaches.  All other systems reviewed and are negative.    Physical Exam Updated Vital Signs BP (!) 111/92 (BP Location: Left Arm)   Pulse 87   Temp 98.1 F (36.7  C) (Oral)   Resp 16   Ht 6\' 2"  (1.88 m)   Wt 102.1 kg   SpO2 98%   BMI 28.90 kg/m   Physical Exam Vitals signs and nursing note reviewed.  Constitutional:      Appearance: He is well-developed.  HENT:     Head: Normocephalic and atraumatic.     Mouth/Throat:     Mouth: Mucous membranes are moist.  Eyes:     Conjunctiva/sclera: Conjunctivae normal.     Pupils: Pupils are equal, round, and reactive to light.  Neck:     Musculoskeletal: Neck supple.  Cardiovascular:     Rate and Rhythm: Normal rate  and regular rhythm.     Pulses: Normal pulses.     Heart sounds: Normal heart sounds. No murmur.  Pulmonary:     Effort: Pulmonary effort is normal. No respiratory distress.     Breath sounds: Normal breath sounds.  Abdominal:     Palpations: Abdomen is soft.     Tenderness: There is no abdominal tenderness.  Skin:    General: Skin is warm and dry.  Neurological:     General: No focal deficit present.     Mental Status: He is alert.      ED Treatments / Results  Labs (all labs ordered are listed, but only abnormal results are displayed) Labs Reviewed - No data to display  EKG None  Radiology No results found.  Procedures Procedures (including critical care time)  Medications Ordered in ED Medications - No data to display   Initial Impression / Assessment and Plan / ED Course  I have reviewed the triage vital signs and the nursing notes.  Pertinent labs & imaging results that were available during my care of the patient were reviewed by me and considered in my medical decision making (see chart for details).     Timothy JoeLuther W Gentles is a 62 year old male with no significant medical history presents the ED due to being cold.  Patient with unremarkable vitals.  Patient states that he does not have heat at his home right now.  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  After further discussion suspect that patient has homelessness.  Given resources.  Given something to eat while in the ED.  Has no specific complaints.  No chest pain, no shortness of breath.  Appears neurologically intact.  Clear breath sounds.  No fever.  Normal vitals. No suspicion for infectious process.  Suspect likely social visit.  This chart was dictated using voice recognition software.  Despite best efforts to proofread,  errors can occur which can change the documentation meaning.   Final Clinical Impressions(s) / ED Diagnoses   Final diagnoses:  Chills    ED Discharge Orders    None         Virgina NorfolkCuratolo, Kurt Hoffmeier, DO 05/16/18 [id number]

## 2018-05-24 ENCOUNTER — Other Ambulatory Visit: Payer: Self-pay

## 2018-05-24 ENCOUNTER — Encounter (HOSPITAL_COMMUNITY): Payer: Self-pay

## 2018-05-24 ENCOUNTER — Emergency Department (HOSPITAL_COMMUNITY)
Admission: EM | Admit: 2018-05-24 | Discharge: 2018-05-25 | Disposition: A | Discharge status: Home / Self Care | Payer: Medicaid Other | Attending: Emergency Medicine | Admitting: Emergency Medicine

## 2018-05-24 DIAGNOSIS — M25562 Pain in left knee: Secondary | ICD-10-CM | POA: Insufficient documentation

## 2018-05-24 DIAGNOSIS — F17200 Nicotine dependence, unspecified, uncomplicated: Secondary | ICD-10-CM | POA: Diagnosis not present

## 2018-05-24 DIAGNOSIS — G8929 Other chronic pain: Secondary | ICD-10-CM

## 2018-05-24 DIAGNOSIS — M1990 Unspecified osteoarthritis, unspecified site: Secondary | ICD-10-CM | POA: Diagnosis not present

## 2018-05-24 DIAGNOSIS — I10 Essential (primary) hypertension: Secondary | ICD-10-CM | POA: Diagnosis not present

## 2018-05-24 NOTE — ED Triage Notes (Signed)
Pt reports L knee pain and that it "gave out" today. He also states that his fingers and toes are cold. Endorses ETOH. A&Ox4.

## 2018-05-25 NOTE — ED Notes (Signed)
Pt was able to ambulate without assistance 100 ft and also to the restroom unassisted

## 2018-05-25 NOTE — Discharge Instructions (Signed)
You may alternate Tylenol 1000 mg every 6 hours as needed for pain and Ibuprofen 800 mg every 8 hours as needed for pain.  Please take Ibuprofen with food. ° °

## 2018-05-25 NOTE — ED Provider Notes (Signed)
TIME SEEN: 1:06 AM  CHIEF COMPLAINT: Left knee pain  HPI: Patient is a 63 year old male with history of hypertension, hyperlipidemia, mental retardation who presents the emergency department with left knee pain.  This has been an ongoing issue for him.  States his left knee "gave out on me" today.  Did not fall to the ground or hit his head.  Has not followed up with an orthopedic physician for this.  Had x-rays on December 21 which showed arthritic changes.  No acute abnormality.  No redness or warmth.  Has a knee sleeve on currently.  Also reports drinking several beers today.  States he is outside for an extended period of time in his fingers and toes felt cold but this has resolved.  He is not homeless.  ROS: See HPI Constitutional: no fever  Eyes: no drainage  ENT: no runny nose   Cardiovascular:  no chest pain  Resp: no SOB  GI: no vomiting GU: no dysuria Integumentary: no rash  Allergy: no hives  Musculoskeletal: no leg swelling  Neurological: no slurred speech ROS otherwise negative  PAST MEDICAL HISTORY/PAST SURGICAL HISTORY:  Past Medical History:  Diagnosis Date  . Arthritis   . Essential hypertension   . GERD (gastroesophageal reflux disease)   . Hyperlipidemia   . Mental retardation     MEDICATIONS:  Prior to Admission medications   Medication Sig Start Date End Date Taking? Authorizing Provider  naproxen (NAPROSYN) 500 MG tablet Take 1 tablet (500 mg total) by mouth 2 (two) times daily. 05/13/18   Janne NapoleonNeese, Hope M, NP    ALLERGIES:  Allergies  Allergen Reactions  . Sulfamethoxazole-Trimethoprim Other (See Comments)    Unknown     SOCIAL HISTORY:  Social History   Tobacco Use  . Smoking status: Current Some Day Smoker  . Smokeless tobacco: Never Used  Substance Use Topics  . Alcohol use: Yes    Comment: Daily.     FAMILY HISTORY: Family History  Problem Relation Age of Onset  . Diabetes Mother   . Hypertension Mother   . Emphysema Father   .  Hypertension Brother   . Stroke Neg Hx     EXAM: BP (!) 130/99 (BP Location: Left Arm)   Pulse 85   Temp 97.7 F (36.5 C) (Oral)   Resp 16   Wt 99.8 kg   SpO2 94%   BMI 28.25 kg/m  CONSTITUTIONAL: Alert and oriented and responds appropriately to questions. Well-appearing; well-nourished HEAD: Normocephalic EYES: Conjunctivae clear, pupils appear equal, EOMI ENT: normal nose; moist mucous membranes NECK: Supple, no meningismus, no nuchal rigidity, no LAD  CARD: RRR; S1 and S2 appreciated; no murmurs, no clicks, no rubs, no gallops RESP: Normal chest excursion without splinting or tachypnea; breath sounds clear and equal bilaterally; no wheezes, no rhonchi, no rales, no hypoxia or respiratory distress, speaking full sentences ABD/GI: Normal bowel sounds; non-distended; soft, non-tender, no rebound, no guarding, no peritoneal signs, no hepatosplenomegaly BACK:  The back appears normal and is non-tender to palpation, there is no CVA tenderness EXT: Normal ROM in all joints; non-tender to palpation; no edema; normal capillary refill; no cyanosis, no calf tenderness or swelling, patient complains of pain in the left knee but has no bony tenderness on examination or joint effusion, no redness or warmth, compartments are soft in the left leg, patient has full range of motion in this joint, extremities are warm and well-perfused    SKIN: Normal color for age and race; warm; no  rash NEURO: Moves all extremities equally PSYCH: The patient's mood and manner are appropriate. Grooming and personal hygiene are appropriate.  MEDICAL DECISION MAKING: Patient here with left knee pain.  Has been here frequently for the same.  X-ray on December 21 showed no acute abnormality.  I do not feel he needs repeat imaging today.  No signs of septic arthritis, gout, DVT, arterial obstruction.  No bony tenderness on examination today.  Recommended alternating Tylenol and Motrin.  He states he was already given  orthopedic follow-up information.  He has been able to ambulate without difficulty.  I feel he is safe to be discharged.  He plans to take a bus home in the morning.  At this time, I do not feel there is any life-threatening condition present. I have reviewed and discussed all results (EKG, imaging, lab, urine as appropriate) and exam findings with patient/family. I have reviewed nursing notes and appropriate previous records.  I feel the patient is safe to be discharged home without further emergent workup and can continue workup as an outpatient as needed. Discussed usual and customary return precautions. Patient/family verbalize understanding and are comfortable with this plan.  Outpatient follow-up has been provided as needed. All questions have been answered.      Denis Koppel, Layla MawKristen N, DO 05/25/18 [phone]

## 2018-05-25 NOTE — ED Notes (Signed)
Patient verbalized understanding of DC instructions

## 2018-05-25 NOTE — ED Notes (Signed)
Pt given juice, peanut butter, and crackers.

## 2018-06-25 ENCOUNTER — Emergency Department (HOSPITAL_COMMUNITY)
Admission: EM | Admit: 2018-06-25 | Discharge: 2018-06-25 | Disposition: A | Discharge status: Home / Self Care | Payer: Medicaid Other | Attending: Emergency Medicine | Admitting: Emergency Medicine

## 2018-06-25 DIAGNOSIS — M25561 Pain in right knee: Secondary | ICD-10-CM | POA: Diagnosis not present

## 2018-06-25 DIAGNOSIS — I10 Essential (primary) hypertension: Secondary | ICD-10-CM | POA: Diagnosis not present

## 2018-06-25 DIAGNOSIS — Z96651 Presence of right artificial knee joint: Secondary | ICD-10-CM | POA: Diagnosis not present

## 2018-06-25 DIAGNOSIS — F17210 Nicotine dependence, cigarettes, uncomplicated: Secondary | ICD-10-CM | POA: Insufficient documentation

## 2018-06-25 DIAGNOSIS — G8929 Other chronic pain: Secondary | ICD-10-CM

## 2018-06-25 DIAGNOSIS — Z79899 Other long term (current) drug therapy: Secondary | ICD-10-CM | POA: Diagnosis not present

## 2018-06-25 MED ORDER — ACETAMINOPHEN 325 MG PO TABS
650.0000 mg | ORAL_TABLET | Freq: Once | ORAL | Status: AC
  Administered 2018-06-25: 650 mg via ORAL
  Filled 2018-06-25: qty 2

## 2018-06-25 NOTE — ED Notes (Signed)
RN went to give pt his tylenol and discharge. Pt spewing profanities at RN and demanding food, blankets, and stronger pain medication. Pt got up from bed and stated "I am going to take a piss" RN had a wheelchair at the bedside and patient refused to use the wheelchair and stumbled to the bathroom.  Pt's belongings given to him and sat him in a wheelchair after he used the restroom.  RN attempted to get discharge vitals but patient refused stating "The fucking thing gets too damn tight on my arm for no damn reason, you ain't putting it back on me." RN proceeded to discharge patient

## 2018-06-25 NOTE — ED Triage Notes (Signed)
Per EMS, pt. Was picked up outside [employer] with complaint of right knee pain at 10/10. Pt. Stated that he fell yesterday and hit right knee on the ground . Denied LOC. No report of deformity. Pt. Had hx of left knee replacement. ETOH.

## 2018-06-25 NOTE — ED Provider Notes (Signed)
[facility]-EMERGENCY DEPT Provider Note   CSN: [id number] Arrival date & time: 06/25/18  0554     History   Chief Complaint Chief Complaint  Patient presents with  . right knee pain    HPI Timothy Odonnell is a 63 y.o. male.  HPI   He is here for evaluation of right knee pain which is recurrent and ongoing.  He came by EMS.  He was apparently standing outside of a local pizza establishment when he was picked up.  He states he stayed home last night.  He denies inability to walk.  He is using a brace and requests "pain medicine, not Tylenol."  He denies recent trauma.  There are no other known modifying factors.  Past Medical History:  Diagnosis Date  . Arthritis   . Essential hypertension   . GERD (gastroesophageal reflux disease)   . Hyperlipidemia   . Mental retardation     Patient Active Problem List   Diagnosis Date Noted  . Alcohol use   . Dehydration   . Lactic acidosis 05/05/2017  . Knee pain   . Alcohol withdrawal syndrome without complication (HCC)   . Primary localized osteoarthritis of right knee 12/20/2014  . BARRETTS ESOPHAGUS 02/02/2010  . ALCOHOL ABUSE 01/22/2010  . ALCOHOLIC HEPATITIS 01/06/2010  . LOSS OF WEIGHT 11/25/2009  . FATTY LIVER DISEASE 10/27/2009  . VITAMIN D DEFICIENCY 09/29/2009  . ELECTROCARDIOGRAM, ABNORMAL 09/26/2009  . LIVER FUNCTION TESTS, ABNORMAL, HX OF 09/19/2009  . PSYCHIATRIC DISORDER 08/26/2009  . FATIGUE 10/23/2008  . HYPERLIPIDEMIA 11/24/2006  . HYPERTENSION 11/24/2006  . GERD 11/24/2006  . OSTEOARTHRITIS 11/24/2006  . RETARDATION, MENTAL, MILD 11/07/2006    Past Surgical History:  Procedure Laterality Date  . TOTAL KNEE ARTHROPLASTY Right 12/20/2014   Procedure: RIGHT TOTAL KNEE ARTHROPLASTY;  Surgeon: Frederico Hamman, MD;  Location: [facility] OR;  Service: Orthopedics;  Laterality: Right;        Home Medications    Prior to Admission medications   Medication Sig Start Date End Date Taking?  Authorizing Provider  naproxen (NAPROSYN) 500 MG tablet Take 1 tablet (500 mg total) by mouth 2 (two) times daily. 05/13/18   Janne Napoleon, NP    Family History Family History  Problem Relation Age of Onset  . Diabetes Mother   . Hypertension Mother   . Emphysema Father   . Hypertension Brother   . Stroke Neg Hx     Social History Social History   Tobacco Use  . Smoking status: Current Some Day Smoker  . Smokeless tobacco: Never Used  Substance Use Topics  . Alcohol use: Yes    Comment: Daily.   . Drug use: Yes    Types: Benzodiazepines     Allergies   Sulfamethoxazole-trimethoprim   Review of Systems Review of Systems  All other systems reviewed and are negative.    Physical Exam Updated Vital Signs BP 136/74 (BP Location: Left Arm)   Pulse 72   Temp 97.8 F (36.6 C) (Oral)   Resp 17   SpO2 100%   Physical Exam Vitals signs and nursing note reviewed.  Constitutional:      Appearance: He is well-developed and normal weight. He is not ill-appearing or diaphoretic.  HENT:     Head: Normocephalic and atraumatic.     Right Ear: External ear normal.     Left Ear: External ear normal.  Eyes:     Conjunctiva/sclera: Conjunctivae normal.     Pupils: Pupils are  equal, round, and reactive to light.  Neck:     Musculoskeletal: Normal range of motion and neck supple.     Trachea: Phonation normal.  Cardiovascular:     Rate and Rhythm: Normal rate.  Pulmonary:     Effort: Pulmonary effort is normal.  Musculoskeletal:     Comments: Left knee is tender with slight swelling but he has normal active and passive range of motion of the knee.  Skin:    General: Skin is warm and dry.  Neurological:     Mental Status: He is alert and oriented to person, place, and time.     Cranial Nerves: No cranial nerve deficit.     Sensory: No sensory deficit.     Motor: No abnormal muscle tone.     Coordination: Coordination normal.  Psychiatric:        Mood and Affect:  Mood normal.        Behavior: Behavior normal.      ED Treatments / Results  Labs (all labs ordered are listed, but only abnormal results are displayed) Labs Reviewed - No data to display  EKG None  Radiology No results found.  Procedures Procedures (including critical care time)  Medications Ordered in ED Medications - No data to display   Initial Impression / Assessment and Plan / ED Course  I have reviewed the triage vital signs and the nursing notes.  Pertinent labs & imaging results that were available during my care of the patient were reviewed by me and considered in my medical decision making (see chart for details).      Patient Vitals for the past 24 hrs:  BP Temp Temp src Pulse Resp SpO2  06/25/18 0607 - - - - - 100 %  06/25/18 0604 136/74 97.8 F (36.6 C) Oral 72 17 94 %    7:24 AM Reevaluation with update and discussion. After initial assessment and treatment, an updated evaluation reveals no change in clinical status.  Findings discussed with the patient and all questions were answered. Mancel Bale   Medical Decision Making: Chronic knee pain, with known degenerative joint abnormality.  Doubt fracture, septic arthritis, bursitis or radiculopathy.  CRITICAL CARE-no Performed by: Mancel Bale   Nursing Notes Reviewed/ Care Coordinated Applicable Imaging Reviewed Interpretation of Laboratory Data incorporated into ED treatment  The patient appears reasonably screened and/or stabilized for discharge and I doubt any other medical condition or other [facility] requiring further screening, evaluation, or treatment in the ED at this time prior to discharge.  Plan: Home Medications-use Tylenol for pain; Home Treatments-heat to affected area; return here if the recommended treatment, does not improve the symptoms; Recommended follow up-PCP PRN    Final Clinical Impressions(s) / ED Diagnoses   Final diagnoses:  None    ED Discharge Orders    None        Mancel Bale, MD 06/25/18 [id number]

## 2018-06-25 NOTE — ED Notes (Signed)
Bed: YH88 Expected date:  Expected time:  Means of arrival:  Comments: EMS 63 yo male with right knee pain/knee gave out and he fell

## 2018-06-25 NOTE — Discharge Instructions (Addendum)
There is no indication of any problems with your knee besides the already known arthritis.  The treatment for this type of discomfort is heat on the sore area 3 or 4 times a day, use your knee brace, and take Tylenol every 4 hours for pain.  Follow-up with your primary care doctor, as needed for problems.

## 2018-10-08 ENCOUNTER — Emergency Department (HOSPITAL_COMMUNITY)
Admission: EM | Admit: 2018-10-08 | Discharge: 2018-10-08 | Disposition: A | Discharge status: Home / Self Care | Payer: Medicaid Other | Attending: Emergency Medicine | Admitting: Emergency Medicine

## 2018-10-08 ENCOUNTER — Other Ambulatory Visit: Payer: Self-pay

## 2018-10-08 DIAGNOSIS — F17210 Nicotine dependence, cigarettes, uncomplicated: Secondary | ICD-10-CM | POA: Diagnosis not present

## 2018-10-08 DIAGNOSIS — Z96651 Presence of right artificial knee joint: Secondary | ICD-10-CM | POA: Diagnosis not present

## 2018-10-08 DIAGNOSIS — M1712 Unilateral primary osteoarthritis, left knee: Secondary | ICD-10-CM | POA: Insufficient documentation

## 2018-10-08 DIAGNOSIS — I10 Essential (primary) hypertension: Secondary | ICD-10-CM | POA: Diagnosis not present

## 2018-10-08 DIAGNOSIS — M25562 Pain in left knee: Secondary | ICD-10-CM | POA: Diagnosis present

## 2018-10-08 MED ORDER — MELOXICAM 7.5 MG PO TABS: 7.5000 mg | ORAL_TABLET | Freq: Every day | ORAL | 1 refills | Status: AC

## 2018-10-08 NOTE — ED Triage Notes (Signed)
Pt complains of left knee pain that's been there for years. Having a harder time walking. Pt reports etoh. CBG was 62 on arrival, oral glucose given and recheck was 115.

## 2018-10-08 NOTE — ED Notes (Signed)
Bed: LK95 Expected date:  Expected time:  Means of arrival:  Comments: Ems etoh leg pain from walking x6 months

## 2018-10-08 NOTE — ED Notes (Signed)
Pt given and verbalized d/c instructions and need for follow up with ortho. Told to return if s/s worsen. Pt insisted on walking out of department and was escorted by security for safety purposes. Pt safely made it to the bus stop. No further distress or questions upon discharge.

## 2018-10-08 NOTE — ED Provider Notes (Signed)
Emergency Department Provider Note   I have reviewed the triage vital signs and the nursing notes.   HISTORY  Chief Complaint Knee Pain   HPI Timothy Odonnell is a 63 y.o. male with chronic left knee pain who presents for same.  No acute changes.  He thinks he needs surgery as he has surgery on his right knee which seemed to help but does remember the doctor was a did it and wants me to do it.  I offered medication he does not want that.  Patient states he was able to mow the whole lawn yesterday without too much difficulty but it hurt in the melanite.  No recent fevers, worsening swelling, trauma or other symptoms.  Has been seen here multiple times for the same and recently had an x-ray about 6 months ago showing OA.   No other associated or modifying symptoms.    Past Medical History:  Diagnosis Date  . Arthritis   . Essential hypertension   . GERD (gastroesophageal reflux disease)   . Hyperlipidemia   . Mental retardation     Patient Active Problem List   Diagnosis Date Noted  . Alcohol use   . Dehydration   . Lactic acidosis 05/05/2017  . Knee pain   . Alcohol withdrawal syndrome without complication (HCC)   . Primary localized osteoarthritis of right knee 12/20/2014  . BARRETTS ESOPHAGUS 02/02/2010  . ALCOHOL ABUSE 01/22/2010  . ALCOHOLIC HEPATITIS 01/06/2010  . LOSS OF WEIGHT 11/25/2009  . FATTY LIVER DISEASE 10/27/2009  . VITAMIN D DEFICIENCY 09/29/2009  . ELECTROCARDIOGRAM, ABNORMAL 09/26/2009  . LIVER FUNCTION TESTS, ABNORMAL, HX OF 09/19/2009  . PSYCHIATRIC DISORDER 08/26/2009  . FATIGUE 10/23/2008  . HYPERLIPIDEMIA 11/24/2006  . HYPERTENSION 11/24/2006  . GERD 11/24/2006  . OSTEOARTHRITIS 11/24/2006  . RETARDATION, MENTAL, MILD 11/07/2006    Past Surgical History:  Procedure Laterality Date  . TOTAL KNEE ARTHROPLASTY Right 12/20/2014   Procedure: RIGHT TOTAL KNEE ARTHROPLASTY;  Surgeon: Frederico Hammananiel Caffrey, MD;  [facility];  Service: Orthopedics;   Laterality: Right;    Current Outpatient Rx  . Order #: [id number] Class: Normal  . Order #: [id number] Class: Normal    Allergies Sulfamethoxazole-trimethoprim  Family History  Problem Relation Age of Onset  . Diabetes Mother   . Hypertension Mother   . Emphysema Father   . Hypertension Brother   . Stroke Neg Hx     Social History Social History   Tobacco Use  . Smoking status: Current Some Day Smoker  . Smokeless tobacco: Never Used  Substance Use Topics  . Alcohol use: Yes    Comment: Daily.   . Drug use: Yes    Types: Benzodiazepines    Review of Systems  All other systems negative except as documented in the HPI. All pertinent positives and negatives as reviewed in the HPI. ____________________________________________   PHYSICAL EXAM:  VITAL SIGNS: ED Triage Vitals  Enc Vitals Group     BP 10/08/18 0501 117/87     Pulse Rate 10/08/18 0501 60     Resp 10/08/18 0501 18     Temp 10/08/18 0501 99 F (37.2 C)     Temp Source 10/08/18 0501 Oral     SpO2 10/08/18 0501 99 %     Weight 10/08/18 0505 220 lb 7.4 oz (100 kg)     Height 10/08/18 0505 6\' 1"  (1.854 m)     Head Circumference --      Peak Flow --  Pain Score 10/08/18 0505 8     Pain Loc --      Pain Edu? --      Excl. in GC? --     Constitutional: Alert and oriented. Well appearing and in no acute distress. Eyes: Conjunctivae are normal. PERRL. EOMI. Head: Atraumatic. Nose: No congestion/rhinnorhea. Mouth/Throat: Mucous membranes are moist.  Oropharynx non-erythematous. Neck: No stridor.  No meningeal signs.   Cardiovascular: Normal rate, regular rhythm. Good peripheral circulation. Grossly normal heart sounds.   Respiratory: Normal respiratory effort.  No retractions. Lungs CTAB. Gastrointestinal: Soft and nontender. No distention.  Musculoskeletal: Pain with flexion of his left knee with palpable crepitus.  No warmth, erythema or tenderness to suggest septic arthritis.  Low suspicion for  significant effusion.  Suspect this is just his osteoarthritis for which she needs to follow-up with orthopedics which she will do. Neurologic:  Normal speech and language. No gross focal neurologic deficits are appreciated.  Skin:  Skin is warm, dry and intact. No rash noted.   ____________________________________________   INITIAL IMPRESSION / ASSESSMENT AND PLAN / ED COURSE  Chronic knee pain without any acute exacerbation.  Refer to orthopedics.  Start on anti-inflammatories.  Patient states he has been drinking tonight but clinically is oriented, not slurring his speech, doesn't appear to be in danger to self.  Stable for discharge.  Pertinent labs & imaging results that were available during my care of the patient were reviewed by me and considered in my medical decision making (see chart for details).  A medical screening exam was performed and I feel the patient has had an appropriate workup for their chief complaint at this time and likelihood of emergent condition existing is low. They have been counseled on decision, discharge, follow up and which symptoms necessitate immediate return to the emergency department. They or their family verbally stated understanding and agreement with plan and discharged in stable condition.   ____________________________________________  FINAL CLINICAL IMPRESSION(S) / ED DIAGNOSES  Final diagnoses:  Primary osteoarthritis of left knee     MEDICATIONS GIVEN DURING THIS VISIT:  Medications - No data to display   NEW OUTPATIENT MEDICATIONS STARTED DURING THIS VISIT:  Discharge Medication List as of 10/08/2018  5:32 AM    START taking these medications   Details  meloxicam (MOBIC) 7.5 MG tablet Take 1 tablet (7.5 mg total) by mouth daily., Starting Sun 10/08/2018, Normal        Note:  This note was prepared with assistance of Dragon voice recognition software. Occasional wrong-word or sound-a-like substitutions may have occurred due to the  inherent limitations of voice recognition software.   Erikka Follmer, Barbara Cower, MD 10/08/18 [phone]

## 2018-10-20 ENCOUNTER — Emergency Department (HOSPITAL_COMMUNITY)
Admission: EM | Admit: 2018-10-20 | Discharge: 2018-10-23 | Disposition: E | Payer: Medicaid Other | Attending: Emergency Medicine | Admitting: Emergency Medicine

## 2018-10-20 DIAGNOSIS — I468 Cardiac arrest due to other underlying condition: Secondary | ICD-10-CM

## 2018-10-20 DIAGNOSIS — I469 Cardiac arrest, cause unspecified: Secondary | ICD-10-CM | POA: Insufficient documentation

## 2018-10-20 LAB — CBG MONITORING, ED
Glucose-Capillary: 31 mg/dL — CL (ref 70–99)
Glucose-Capillary: 127 mg/dL — ABNORMAL HIGH (ref 70–99)

## 2018-10-20 MED ORDER — DEXTROSE 50 % IV SOLN
INTRAVENOUS | Status: AC | PRN
  Administered 2018-10-20: 1 via INTRAVENOUS

## 2018-10-20 MED ORDER — SODIUM BICARBONATE 8.4 % IV SOLN
INTRAVENOUS | Status: AC | PRN
  Administered 2018-10-20 (×2): 50 meq via INTRAVENOUS

## 2018-10-20 MED ORDER — SODIUM CHLORIDE 0.9 % IV SOLN
INTRAVENOUS | Status: AC | PRN
  Administered 2018-10-20: 1000 mL/h via INTRAVENOUS

## 2018-10-20 MED ORDER — EPINEPHRINE 1 MG/10ML IJ SOSY
PREFILLED_SYRINGE | INTRAMUSCULAR | Status: AC | PRN
  Administered 2018-10-20 (×2): 1 mg via INTRAVENOUS
  Administered 2018-10-20: 0.1 mg via INTRAVENOUS
  Administered 2018-10-20 (×3): 1 mg via INTRAVENOUS

## 2018-10-21 LAB — PREPARE FRESH FROZEN PLASMA
Unit division: 0
Unit division: 0

## 2018-10-21 LAB — BPAM FFP
Blood Product Expiration Date: 202006062359
Blood Product Expiration Date: 202006062359
ISSUE DATE / TIME: 202005290453
ISSUE DATE / TIME: 202005290453
Unit Type and Rh: 600
Unit Type and Rh: 6200

## 2018-10-21 LAB — BPAM RBC
Blood Product Expiration Date: 202006272359
Blood Product Expiration Date: 202006272359
ISSUE DATE / TIME: 202005290453
ISSUE DATE / TIME: 202005290453
Unit Type and Rh: 5100
Unit Type and Rh: 5100

## 2018-10-21 LAB — TYPE AND SCREEN
Unit division: 0
Unit division: 0

## 2018-10-23 MED FILL — Medication: Qty: 1 | Status: AC

## 2018-10-23 NOTE — ED Notes (Signed)
1st unit PRBC infusing 

## 2018-10-23 NOTE — ED Notes (Signed)
2nd unit PRBC infusing , CPR continues.

## 2018-10-23 NOTE — ED Notes (Signed)
Time of death 49 declared by Dr. Elesa Massed ( EDP).

## 2018-10-23 NOTE — Progress Notes (Signed)
Chest Tube Insertion Procedure Note  Indications:  Clinically significant Pneumothorax and Hemothorax  Pre-operative Diagnosis: Pneumothorax and Hemothorax  Post-operative Diagnosis: Pneumothorax and Hemothorax  Procedure Details  No Informed consent was obtained for the procedure because of emergency of situation. Pt unresponsive and actively being coded.  After betadine skin prep, using standard technique, a 28 French tube was placed in the left lateral 4 rib space.  Findings:  Large amount of soft tissue swelling in anterolateral L chest; +rush of air, about 200cc in pleuravac on insertion  Estimated Blood Loss:  200 mL         Specimens:  None              Complications:  None; patient tolerated the procedure well.         Disposition: ED         Condition: unstable  Attending Attestation: I performed the procedure.  Mary Sella. Andrey Campanile, MD, FACS General, Bariatric, & Minimally Invasive Surgery [facility], [place]

## 2018-10-23 NOTE — ED Notes (Signed)
Pulse check - no pulse/VFib - shocked 2nd time .

## 2018-10-23 NOTE — ED Notes (Signed)
Pulse check - asystole , cardiac ultrasound performed by EDP - minimal cardiac activity activity. 1st unit FFP started.

## 2018-10-23 NOTE — ED Notes (Signed)
CPR continues , asystole , EDP and trauma MD at bedside .

## 2018-10-23 NOTE — ED Notes (Signed)
Pulse check asystole , CPR continues. 
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
## 2018-10-23 NOTE — Progress Notes (Signed)
Chaplain responded to Trauma; pt not available.  Please call if support is needed.   Timothy Odonnell, [place] [phone]    2018-11-14 0500  Clinical Encounter Type  Visited With Patient and family together  Visit Type Initial;Trauma

## 2018-10-23 NOTE — ED Notes (Addendum)
[facility] notified by RN. Referal No. [id number], Dutch Quint. Post mortem care rendered . Patient will be a medical examiner case .

## 2018-10-23 NOTE — ED Notes (Signed)
Transported to morgue.

## 2018-10-23 NOTE — ED Notes (Signed)
Pulse check - asystole /VFib , shocked x1 - CPR continues.
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
## 2018-10-23 DEATH — deceased

## 2018-11-22 DEATH — deceased
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
